# Patient Record
Sex: Female | Born: 1950 | Race: Black or African American | Hispanic: No | Marital: Married | Smoking: Never smoker
Health system: Southern US, Community
[De-identification: ages and names within clinical notes are randomized; demographics above are authoritative.]

## PROBLEM LIST (undated history)

## (undated) DIAGNOSIS — I517 Cardiomegaly: Secondary | ICD-10-CM

## (undated) DIAGNOSIS — I10 Essential (primary) hypertension: Secondary | ICD-10-CM

## (undated) DIAGNOSIS — F32A Depression, unspecified: Secondary | ICD-10-CM

## (undated) DIAGNOSIS — H9190 Unspecified hearing loss, unspecified ear: Secondary | ICD-10-CM

## (undated) DIAGNOSIS — E7800 Pure hypercholesterolemia, unspecified: Secondary | ICD-10-CM

## (undated) DIAGNOSIS — M1990 Unspecified osteoarthritis, unspecified site: Secondary | ICD-10-CM

## (undated) HISTORY — PX: ABDOMINAL HYSTERECTOMY: SHX81

## (undated) HISTORY — PX: TUMOR EXCISION: SHX421

## (undated) HISTORY — PX: INGUINAL HERNIA REPAIR: SUR1180

## (undated) HISTORY — DX: Depression, unspecified: F32.A

## (undated) HISTORY — PX: FOOT SURGERY: SHX648

## (undated) HISTORY — DX: Unspecified osteoarthritis, unspecified site: M19.90

## (undated) HISTORY — DX: Cardiomegaly: I51.7

---

## 1998-09-13 ENCOUNTER — Ambulatory Visit (HOSPITAL_COMMUNITY): Admission: RE | Admit: 1998-09-13 | Discharge: 1998-09-13 | Payer: Self-pay | Admitting: Orthopedic Surgery

## 1998-09-13 ENCOUNTER — Encounter: Payer: Self-pay | Admitting: Orthopedic Surgery

## 1998-10-12 ENCOUNTER — Ambulatory Visit (HOSPITAL_COMMUNITY): Admission: RE | Admit: 1998-10-12 | Discharge: 1998-10-12 | Payer: Self-pay | Admitting: Orthopedic Surgery

## 1999-04-10 ENCOUNTER — Ambulatory Visit (HOSPITAL_COMMUNITY): Admission: RE | Admit: 1999-04-10 | Discharge: 1999-04-10 | Payer: Self-pay

## 1999-05-14 ENCOUNTER — Ambulatory Visit (HOSPITAL_COMMUNITY): Admission: RE | Admit: 1999-05-14 | Discharge: 1999-05-14 | Payer: Self-pay

## 1999-05-16 ENCOUNTER — Encounter: Admission: RE | Admit: 1999-05-16 | Discharge: 1999-05-16 | Payer: Self-pay | Admitting: Obstetrics

## 1999-05-17 ENCOUNTER — Ambulatory Visit (HOSPITAL_COMMUNITY): Admission: RE | Admit: 1999-05-17 | Discharge: 1999-05-17 | Payer: Self-pay

## 1999-05-17 ENCOUNTER — Encounter (INDEPENDENT_AMBULATORY_CARE_PROVIDER_SITE_OTHER): Payer: Self-pay | Admitting: Specialist

## 1999-06-19 ENCOUNTER — Encounter: Admission: RE | Admit: 1999-06-19 | Discharge: 1999-06-19 | Payer: Self-pay | Admitting: Hematology and Oncology

## 2000-01-09 ENCOUNTER — Encounter: Admission: RE | Admit: 2000-01-09 | Discharge: 2000-01-09 | Payer: Self-pay | Admitting: Internal Medicine

## 2000-06-25 ENCOUNTER — Encounter: Admission: RE | Admit: 2000-06-25 | Discharge: 2000-06-25 | Payer: Self-pay | Admitting: Obstetrics

## 2000-08-11 ENCOUNTER — Encounter: Admission: RE | Admit: 2000-08-11 | Discharge: 2000-08-11 | Payer: Self-pay | Admitting: Obstetrics & Gynecology

## 2000-10-20 ENCOUNTER — Encounter: Admission: RE | Admit: 2000-10-20 | Discharge: 2000-10-20 | Payer: Self-pay | Admitting: Obstetrics & Gynecology

## 2000-10-30 ENCOUNTER — Ambulatory Visit (HOSPITAL_COMMUNITY): Admission: RE | Admit: 2000-10-30 | Discharge: 2000-10-30 | Payer: Self-pay | Admitting: Obstetrics & Gynecology

## 2001-03-15 ENCOUNTER — Encounter: Admission: RE | Admit: 2001-03-15 | Discharge: 2001-03-15 | Payer: Self-pay

## 2002-04-21 ENCOUNTER — Encounter: Admission: RE | Admit: 2002-04-21 | Discharge: 2002-04-21 | Payer: Self-pay | Admitting: Internal Medicine

## 2002-04-25 ENCOUNTER — Encounter: Admission: RE | Admit: 2002-04-25 | Discharge: 2002-04-25 | Payer: Self-pay | Admitting: Internal Medicine

## 2002-04-25 ENCOUNTER — Encounter: Payer: Self-pay | Admitting: Internal Medicine

## 2003-03-23 ENCOUNTER — Encounter: Admission: RE | Admit: 2003-03-23 | Discharge: 2003-03-23 | Payer: Self-pay | Admitting: Obstetrics and Gynecology

## 2003-04-04 ENCOUNTER — Encounter: Admission: RE | Admit: 2003-04-04 | Discharge: 2003-04-04 | Payer: Self-pay | Admitting: Internal Medicine

## 2003-10-11 ENCOUNTER — Emergency Department (HOSPITAL_COMMUNITY): Admission: EM | Admit: 2003-10-11 | Discharge: 2003-10-11 | Payer: Self-pay | Admitting: Emergency Medicine

## 2004-01-26 ENCOUNTER — Encounter: Admission: RE | Admit: 2004-01-26 | Discharge: 2004-01-26 | Payer: Self-pay | Admitting: Family Medicine

## 2004-12-06 ENCOUNTER — Ambulatory Visit: Payer: Self-pay | Admitting: Family Medicine

## 2004-12-06 ENCOUNTER — Ambulatory Visit (HOSPITAL_COMMUNITY): Admission: RE | Admit: 2004-12-06 | Discharge: 2004-12-06 | Payer: Self-pay | Admitting: Internal Medicine

## 2004-12-30 ENCOUNTER — Ambulatory Visit: Payer: Self-pay | Admitting: Internal Medicine

## 2005-01-13 ENCOUNTER — Ambulatory Visit: Payer: Self-pay | Admitting: Internal Medicine

## 2005-02-04 ENCOUNTER — Ambulatory Visit (HOSPITAL_COMMUNITY): Admission: RE | Admit: 2005-02-04 | Discharge: 2005-02-04 | Payer: Self-pay | Admitting: Internal Medicine

## 2005-02-04 ENCOUNTER — Encounter (INDEPENDENT_AMBULATORY_CARE_PROVIDER_SITE_OTHER): Payer: Self-pay | Admitting: Cardiology

## 2005-02-06 ENCOUNTER — Ambulatory Visit: Payer: Self-pay | Admitting: Internal Medicine

## 2005-05-23 ENCOUNTER — Ambulatory Visit: Payer: Self-pay | Admitting: Internal Medicine

## 2005-05-27 ENCOUNTER — Ambulatory Visit (HOSPITAL_COMMUNITY): Admission: RE | Admit: 2005-05-27 | Discharge: 2005-05-27 | Payer: Self-pay | Admitting: Internal Medicine

## 2005-05-30 ENCOUNTER — Ambulatory Visit: Payer: Self-pay | Admitting: Internal Medicine

## 2005-06-20 ENCOUNTER — Encounter: Admission: RE | Admit: 2005-06-20 | Discharge: 2005-06-20 | Payer: Self-pay | Admitting: Internal Medicine

## 2005-09-05 ENCOUNTER — Ambulatory Visit: Payer: Self-pay | Admitting: Hospitalist

## 2006-01-27 ENCOUNTER — Encounter: Admission: RE | Admit: 2006-01-27 | Discharge: 2006-01-27 | Payer: Self-pay | Admitting: Internal Medicine

## 2006-03-23 ENCOUNTER — Encounter (INDEPENDENT_AMBULATORY_CARE_PROVIDER_SITE_OTHER): Payer: Self-pay | Admitting: Internal Medicine

## 2006-03-23 ENCOUNTER — Ambulatory Visit: Payer: Self-pay | Admitting: Internal Medicine

## 2006-03-23 LAB — CONVERTED CEMR LAB
ALT: 9 units/L (ref 0–40)
BUN: 6 mg/dL (ref 6–23)
Cholesterol: 246 mg/dL — ABNORMAL HIGH (ref 0–200)
Creatinine, Ser: 0.73 mg/dL (ref 0.40–1.20)
HDL: 39 mg/dL — ABNORMAL LOW (ref >39)
Potassium: 3.9 meq/L (ref 3.5–5.3)
Triglycerides: 485 mg/dL — ABNORMAL HIGH (ref <150)

## 2006-08-18 ENCOUNTER — Ambulatory Visit: Payer: Self-pay | Admitting: Family Medicine

## 2006-08-18 ENCOUNTER — Ambulatory Visit (HOSPITAL_COMMUNITY): Admission: RE | Admit: 2006-08-18 | Discharge: 2006-08-18 | Payer: Self-pay | Admitting: Internal Medicine

## 2006-08-25 ENCOUNTER — Ambulatory Visit: Payer: Self-pay | Admitting: Family Medicine

## 2006-08-25 ENCOUNTER — Ambulatory Visit (HOSPITAL_COMMUNITY): Admission: RE | Admit: 2006-08-25 | Discharge: 2006-08-25 | Payer: Self-pay | Admitting: Internal Medicine

## 2006-09-01 ENCOUNTER — Ambulatory Visit: Payer: Self-pay | Admitting: Family Medicine

## 2006-09-14 ENCOUNTER — Ambulatory Visit: Payer: Self-pay | Admitting: Family Medicine

## 2006-09-14 ENCOUNTER — Ambulatory Visit (HOSPITAL_COMMUNITY): Admission: RE | Admit: 2006-09-14 | Discharge: 2006-09-14 | Payer: Self-pay | Admitting: Family Medicine

## 2006-09-16 ENCOUNTER — Ambulatory Visit: Payer: Self-pay | Admitting: Family Medicine

## 2006-10-29 ENCOUNTER — Ambulatory Visit (HOSPITAL_COMMUNITY): Admission: RE | Admit: 2006-10-29 | Discharge: 2006-10-29 | Payer: Self-pay | Admitting: Orthopaedic Surgery

## 2006-12-21 ENCOUNTER — Ambulatory Visit: Payer: Self-pay | Admitting: Family Medicine

## 2007-08-27 ENCOUNTER — Ambulatory Visit: Payer: Self-pay | Admitting: Family Medicine

## 2007-08-27 ENCOUNTER — Encounter: Payer: Self-pay | Admitting: Family Medicine

## 2007-11-08 ENCOUNTER — Ambulatory Visit: Payer: Self-pay | Admitting: Internal Medicine

## 2008-02-09 ENCOUNTER — Encounter: Payer: Self-pay | Admitting: Family Medicine

## 2008-02-09 ENCOUNTER — Ambulatory Visit: Payer: Self-pay | Admitting: Internal Medicine

## 2008-02-09 LAB — CONVERTED CEMR LAB
ALT: 14 units/L (ref 0–35)
AST: 17 units/L (ref 0–37)
Basophils Absolute: 0 10*3/uL (ref 0.0–0.1)
Basophils Relative: 0 % (ref 0–1)
CO2: 26 meq/L (ref 19–32)
Calcium: 9.8 mg/dL (ref 8.4–10.5)
Cholesterol: 223 mg/dL — ABNORMAL HIGH (ref 0–200)
Creatinine, Ser: 0.75 mg/dL (ref 0.40–1.20)
Eosinophils Absolute: 0.2 10*3/uL (ref 0.0–0.7)
HCT: 42.7 % (ref 36.0–46.0)
HDL: 49 mg/dL (ref >39)
Hemoglobin: 14 g/dL (ref 12.0–15.0)
Lymphocytes Relative: 24 % (ref 12–46)
MCHC: 32.8 g/dL (ref 30.0–36.0)
MCV: 92.4 fL (ref 78.0–100.0)
Monocytes Absolute: 0.5 10*3/uL (ref 0.1–1.0)
Neutro Abs: 4.6 10*3/uL (ref 1.7–7.7)
Potassium: 4 meq/L (ref 3.5–5.3)
RDW: 13.8 % (ref 11.5–15.5)
Sed Rate: 20 mm/hr (ref 0–22)
Sodium: 142 meq/L (ref 135–145)
Total CHOL/HDL Ratio: 4.6
Total Protein: 8 g/dL (ref 6.0–8.3)
Triglycerides: 319 mg/dL — ABNORMAL HIGH (ref <150)
VLDL: 64 mg/dL — ABNORMAL HIGH (ref 0–40)
WBC: 6.9 10*3/uL (ref 4.0–10.5)

## 2008-07-19 ENCOUNTER — Emergency Department (HOSPITAL_COMMUNITY): Admission: EM | Admit: 2008-07-19 | Discharge: 2008-07-19 | Payer: Self-pay | Admitting: *Deleted

## 2008-09-11 ENCOUNTER — Ambulatory Visit: Payer: Self-pay | Admitting: Family Medicine

## 2008-10-05 ENCOUNTER — Ambulatory Visit: Payer: Self-pay | Admitting: Internal Medicine

## 2008-10-05 ENCOUNTER — Encounter: Payer: Self-pay | Admitting: Family Medicine

## 2008-10-05 LAB — CONVERTED CEMR LAB
ALT: 11 units/L (ref 0–35)
Albumin: 4.3 g/dL (ref 3.5–5.2)
BUN: 14 mg/dL (ref 6–23)
CO2: 26 meq/L (ref 19–32)
Creatinine, Ser: 0.95 mg/dL (ref 0.40–1.20)
Glucose, Bld: 96 mg/dL (ref 70–99)
Total Bilirubin: 0.3 mg/dL (ref 0.3–1.2)

## 2008-11-17 ENCOUNTER — Ambulatory Visit: Payer: Self-pay | Admitting: Internal Medicine

## 2008-11-20 ENCOUNTER — Ambulatory Visit (HOSPITAL_COMMUNITY): Admission: RE | Admit: 2008-11-20 | Discharge: 2008-11-20 | Payer: Self-pay | Admitting: Family Medicine

## 2009-02-14 ENCOUNTER — Ambulatory Visit: Payer: Self-pay | Admitting: Internal Medicine

## 2009-02-15 ENCOUNTER — Encounter (INDEPENDENT_AMBULATORY_CARE_PROVIDER_SITE_OTHER): Payer: Self-pay | Admitting: Internal Medicine

## 2009-02-15 LAB — CONVERTED CEMR LAB
Cholesterol: 246 mg/dL — ABNORMAL HIGH (ref 0–200)
HDL: 46 mg/dL (ref >39)
Triglycerides: 527 mg/dL — ABNORMAL HIGH (ref <150)

## 2009-02-19 ENCOUNTER — Ambulatory Visit: Payer: Self-pay | Admitting: Internal Medicine

## 2009-02-26 ENCOUNTER — Emergency Department (HOSPITAL_COMMUNITY): Admission: EM | Admit: 2009-02-26 | Discharge: 2009-02-26 | Payer: Self-pay | Admitting: Emergency Medicine

## 2009-03-12 ENCOUNTER — Telehealth (INDEPENDENT_AMBULATORY_CARE_PROVIDER_SITE_OTHER): Payer: Self-pay | Admitting: *Deleted

## 2009-07-31 ENCOUNTER — Ambulatory Visit: Payer: Self-pay | Admitting: Internal Medicine

## 2009-07-31 LAB — CONVERTED CEMR LAB
Albumin: 4.8 g/dL (ref 3.5–5.2)
Alkaline Phosphatase: 66 units/L (ref 39–117)
CO2: 23 meq/L (ref 19–32)
Chloride: 104 meq/L (ref 96–112)
Cholesterol: 284 mg/dL — ABNORMAL HIGH (ref 0–200)
LDL Cholesterol: 209 mg/dL — ABNORMAL HIGH (ref 0–99)
Sodium: 140 meq/L (ref 135–145)
Total Bilirubin: 0.5 mg/dL (ref 0.3–1.2)
Total CHOL/HDL Ratio: 5.3

## 2009-08-27 ENCOUNTER — Ambulatory Visit: Payer: Self-pay | Admitting: Family Medicine

## 2009-09-10 ENCOUNTER — Observation Stay (HOSPITAL_COMMUNITY): Admission: EM | Admit: 2009-09-10 | Discharge: 2009-09-11 | Payer: Self-pay | Admitting: Emergency Medicine

## 2009-09-24 ENCOUNTER — Ambulatory Visit: Payer: Self-pay | Admitting: Internal Medicine

## 2009-09-24 LAB — CONVERTED CEMR LAB
BUN: 10 mg/dL (ref 6–23)
Calcium: 9.4 mg/dL (ref 8.4–10.5)
Chloride: 96 meq/L (ref 96–112)
Creatinine, Ser: 0.81 mg/dL (ref 0.40–1.20)
Glucose, Bld: 89 mg/dL (ref 70–99)
HDL: 41 mg/dL (ref >39)
LDL Cholesterol: 75 mg/dL (ref 0–99)
Potassium: 4.4 meq/L (ref 3.5–5.3)
Sodium: 134 meq/L — ABNORMAL LOW (ref 135–145)
Total CHOL/HDL Ratio: 3.6
Triglycerides: 167 mg/dL — ABNORMAL HIGH (ref <150)

## 2009-10-08 ENCOUNTER — Ambulatory Visit: Payer: Self-pay | Admitting: Family Medicine

## 2009-11-04 ENCOUNTER — Emergency Department (HOSPITAL_COMMUNITY): Admission: EM | Admit: 2009-11-04 | Discharge: 2009-11-04 | Payer: Self-pay | Admitting: Emergency Medicine

## 2009-12-05 ENCOUNTER — Ambulatory Visit (HOSPITAL_COMMUNITY): Admission: RE | Admit: 2009-12-05 | Discharge: 2009-12-05 | Payer: Self-pay | Admitting: Internal Medicine

## 2009-12-12 ENCOUNTER — Ambulatory Visit: Payer: Self-pay | Admitting: Family Medicine

## 2010-06-21 ENCOUNTER — Encounter (INDEPENDENT_AMBULATORY_CARE_PROVIDER_SITE_OTHER): Payer: Self-pay | Admitting: *Deleted

## 2010-06-21 LAB — CONVERTED CEMR LAB
ALT: <8 units/L (ref 0–35)
Albumin: 4.5 g/dL (ref 3.5–5.2)
Alkaline Phosphatase: 70 units/L (ref 39–117)
CO2: 27 meq/L (ref 19–32)
Calcium: 9.6 mg/dL (ref 8.4–10.5)
Chloride: 102 meq/L (ref 96–112)
Cholesterol: 241 mg/dL — ABNORMAL HIGH (ref 0–200)
Creatinine, Ser: 0.73 mg/dL (ref 0.40–1.20)
Potassium: 4 meq/L (ref 3.5–5.3)
Total CHOL/HDL Ratio: 5.5
Total Protein: 8.1 g/dL (ref 6.0–8.3)

## 2010-06-22 IMAGING — CT CT ABD-PELV W/ CM
2 of 5 series · 17 of 46 positions shown, 19 images · IV contrast (agent unspecified)
Comparison: Chest and two views abdomen on this same date.

CLINICAL DATA: Abdominal pain.

CT ABDOMEN AND PELVIS WITH CONTRAST
TECHNIQUE: Multidetector CT imaging of the abdomen and pelvis was
performed following the standard protocol during bolus
administration of intravenous contrast.
Contrast: 100 ml [id number].

[Series 2: routine abdomen · axial · 0.62mm/px · z∈[-378,+32]mm · 14 of 92 slices shown, 16 images]
[im 5/92  soft-tissue]
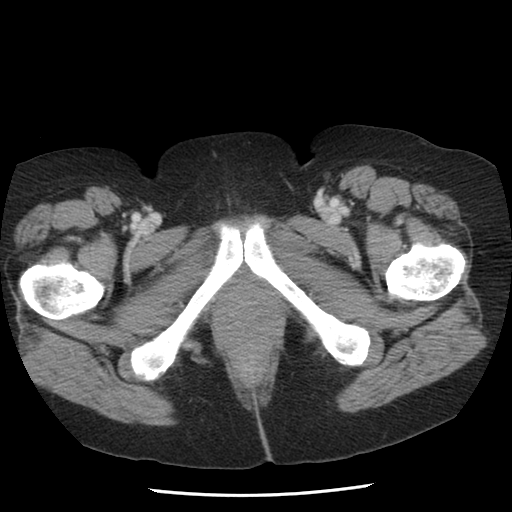
[im 5/92  bone]
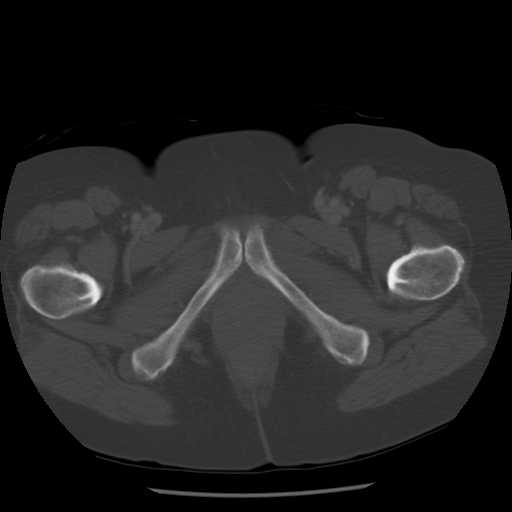
[im 14/92  soft-tissue]
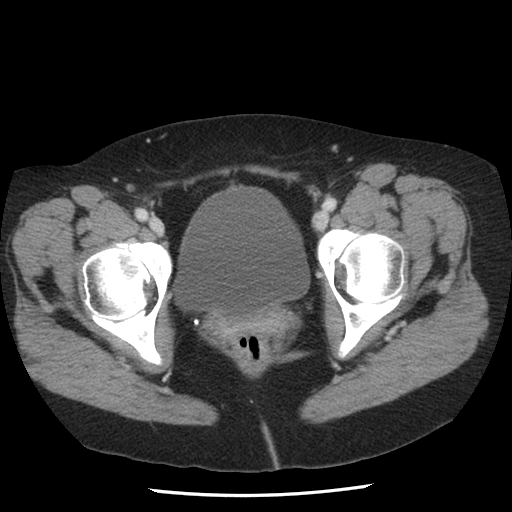
[im 19/92  soft-tissue]
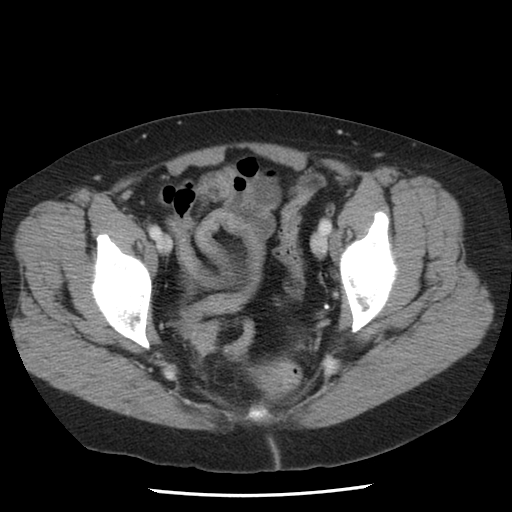
[im 23/92  soft-tissue]
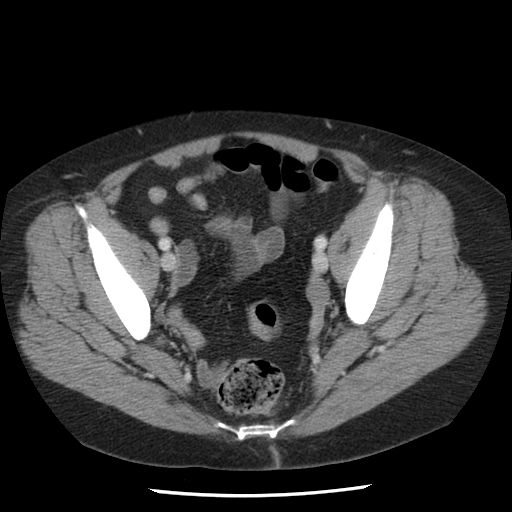
[im 32/92  soft-tissue]
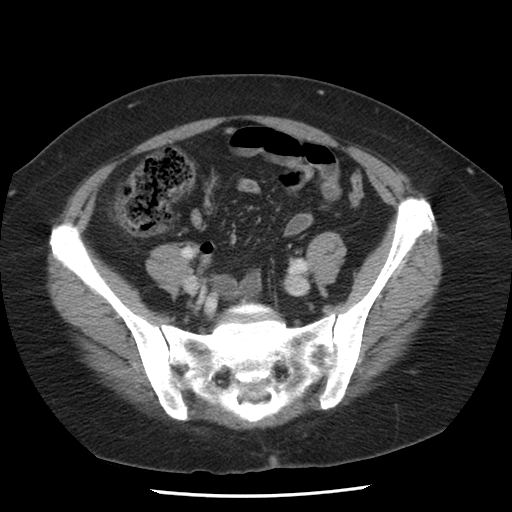
[im 37/92  soft-tissue]
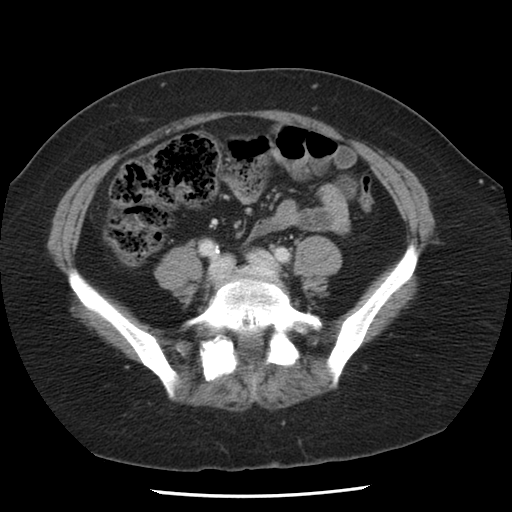
[im 41/92  soft-tissue]
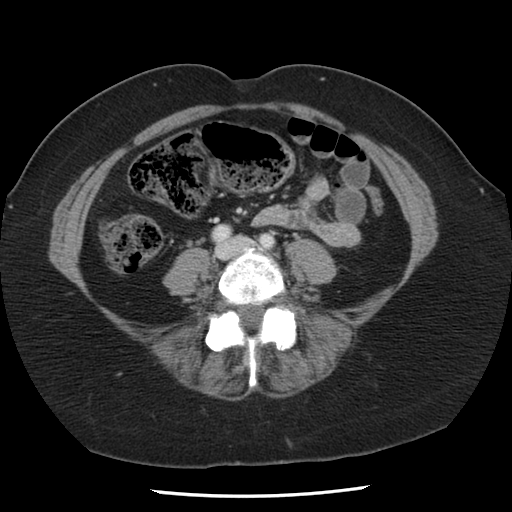
[im 51/92  soft-tissue]
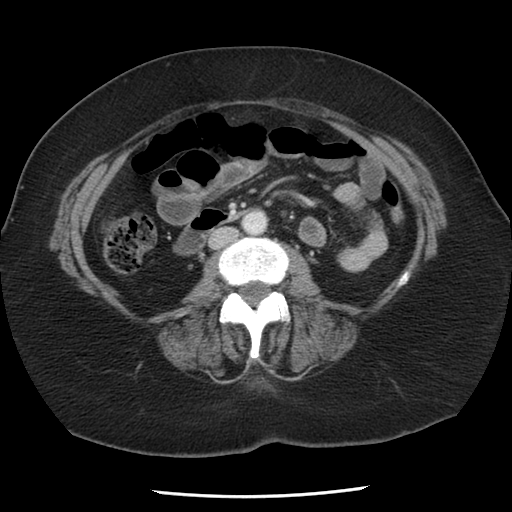
[im 55/92  soft-tissue]
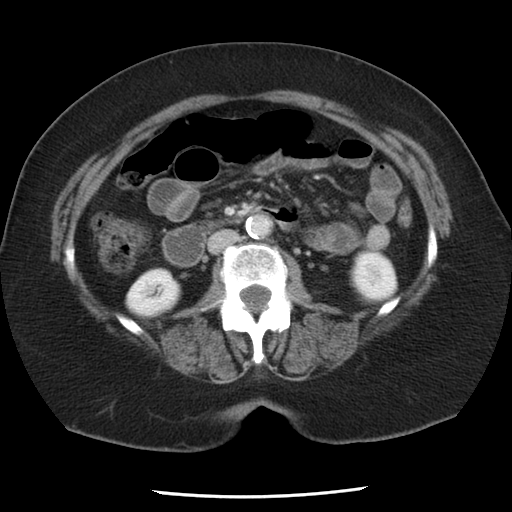
[im 55/92  bone]
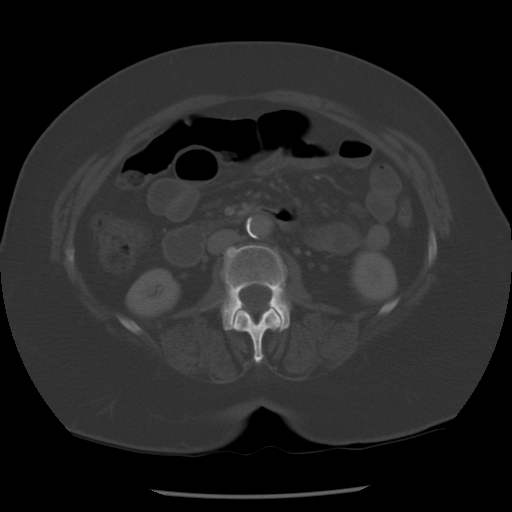
[im 60/92  soft-tissue]
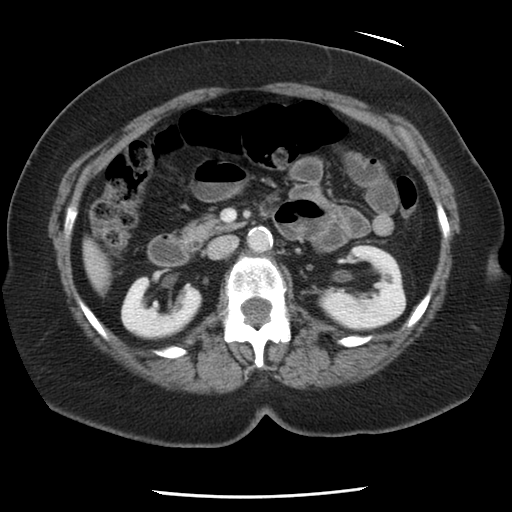
[im 69/92  soft-tissue]
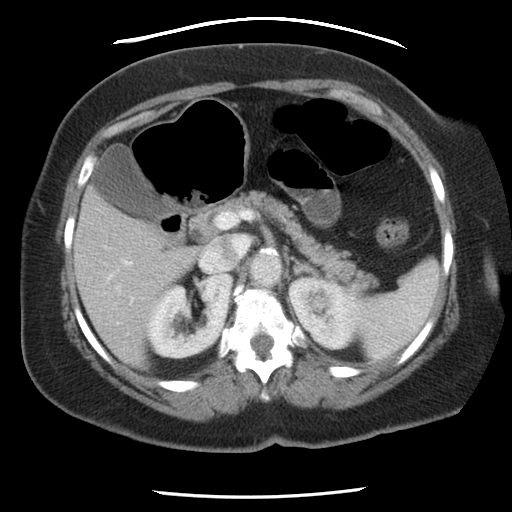
[im 73/92  soft-tissue]
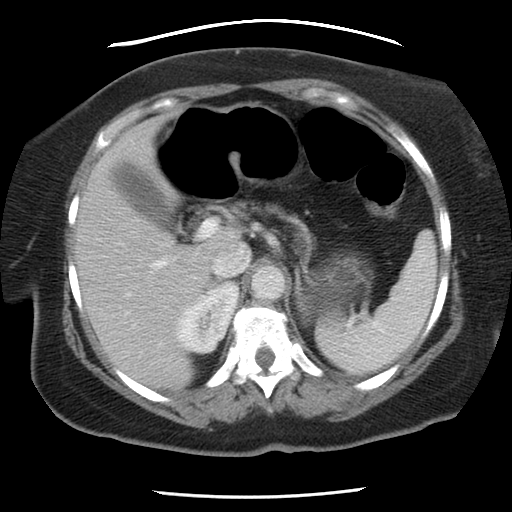
[im 78/92  soft-tissue]
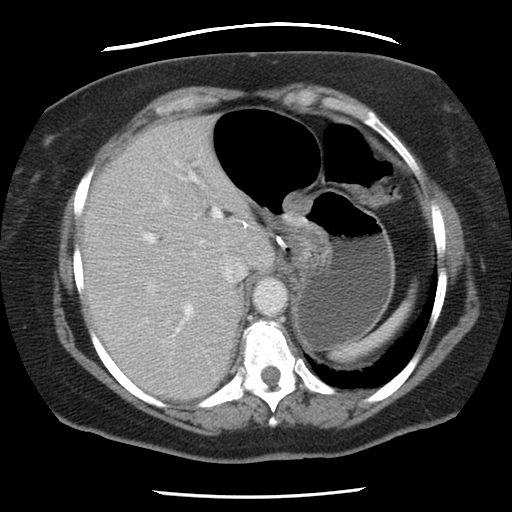
[im 87/92  soft-tissue]
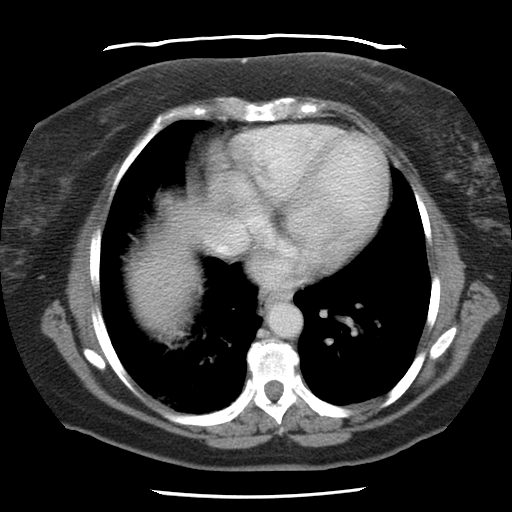

[Series 401: cor · coronal · 0.94mm/px · 3 of 104 slices shown]
[im 35/104  soft-tissue]
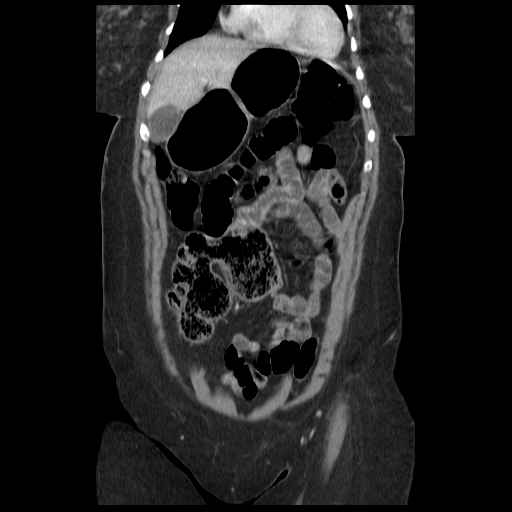
[im 46/104  soft-tissue]
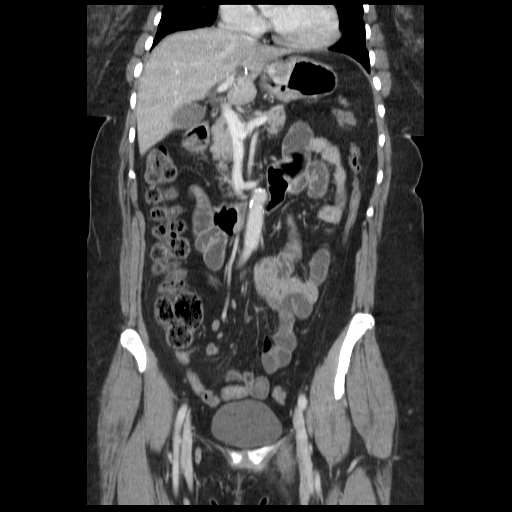
[im 58/104  soft-tissue]
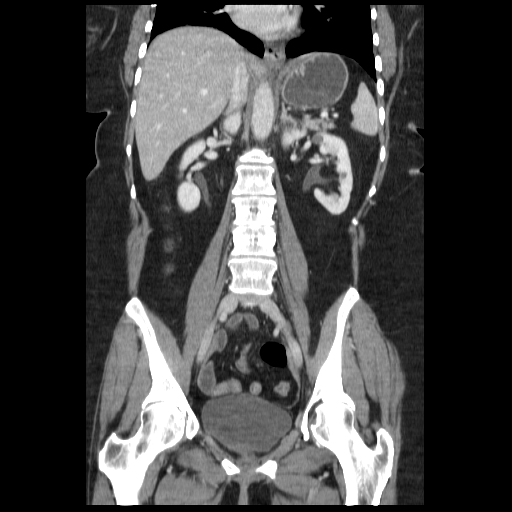

[17 of 46 positions shown; findings below may reference images not displayed]

FINDINGS: There is some dependent atelectatic change in the lung
bases.  No pleural or pericardial effusion.

Postoperative change at the gastroesophageal junction is noted.
Small bowel is unremarkable.  The colon and appendix are
unremarkable.

The liver, gallbladder, biliary tree, pancreas, spleen, adrenal
glands and right kidney are unremarkable.  Small left renal cyst is
noted.  No lymphadenopathy or fluid.  The patient is status post
hysterectomy.  Adnexa are unremarkable.  No focal bony abnormality.
IMPRESSION: 1.  No acute finding or finding to explain the patient's abdominal
pain.
2.  Status post cholecystectomy and hysterectomy.

## 2010-06-29 ENCOUNTER — Encounter: Payer: Self-pay | Admitting: *Deleted

## 2010-06-30 ENCOUNTER — Encounter: Payer: Self-pay | Admitting: Internal Medicine

## 2010-06-30 ENCOUNTER — Encounter: Payer: Self-pay | Admitting: Family Medicine

## 2010-07-01 ENCOUNTER — Encounter: Payer: Self-pay | Admitting: Internal Medicine

## 2010-08-28 LAB — URINALYSIS, ROUTINE W REFLEX MICROSCOPIC
Bilirubin Urine: NEGATIVE
Hgb urine dipstick: NEGATIVE
Ketones, ur: NEGATIVE mg/dL
Protein, ur: NEGATIVE mg/dL
Urobilinogen, UA: 0.2 mg/dL (ref 0.0–1.0)

## 2010-08-28 LAB — CBC
Hemoglobin: 14.1 g/dL (ref 12.0–15.0)
MCHC: 35 g/dL (ref 30.0–36.0)
MCV: 90.1 fL (ref 78.0–100.0)
MCV: 90.1 fL (ref 78.0–100.0)
Platelets: 348 10*3/uL (ref 150–400)
RBC: 4.54 MIL/uL (ref 3.87–5.11)

## 2010-08-28 LAB — DIFFERENTIAL
Monocytes Absolute: 1 10*3/uL (ref 0.1–1.0)
Monocytes Relative: 10 % (ref 3–12)
Neutro Abs: 6.9 10*3/uL (ref 1.7–7.7)

## 2010-08-28 LAB — COMPREHENSIVE METABOLIC PANEL
ALT: 16 U/L (ref 0–35)
AST: 23 U/L (ref 0–37)
Albumin: 3.6 g/dL (ref 3.5–5.2)
Albumin: 4.6 g/dL (ref 3.5–5.2)
BUN: 8 mg/dL (ref 6–23)
Calcium: 8.9 mg/dL (ref 8.4–10.5)
Creatinine, Ser: 0.82 mg/dL (ref 0.4–1.2)
Creatinine, Ser: 0.92 mg/dL (ref 0.4–1.2)
GFR calc Af Amer: >60 mL/min (ref >60)
GFR calc Af Amer: >60 mL/min (ref >60)
GFR calc non Af Amer: >60 mL/min (ref >60)
GFR calc non Af Amer: >60 mL/min (ref >60)
Glucose, Bld: 135 mg/dL — ABNORMAL HIGH (ref 70–99)
Potassium: 3.1 mEq/L — ABNORMAL LOW (ref 3.5–5.1)
Sodium: 122 mEq/L — ABNORMAL LOW (ref 135–145)
Total Bilirubin: 0.6 mg/dL (ref 0.3–1.2)

## 2010-08-28 LAB — CK TOTAL AND CKMB (NOT AT ARMC)
CK, MB: 3.7 ng/mL (ref 0.3–4.0)
Relative Index: 1.1 (ref 0.0–2.5)

## 2010-10-25 NOTE — Group Therapy Note (Signed)
NAME:  Kara Saunders, Kara Saunders                        ACCOUNT NO.:  [id number]   MEDICAL RECORD NO.:  [id number]                   PATIENT TYPE:  OUT   LOCATION:  [facility]                           FACILITY:  [facility]   PHYSICIAN:  Tinnie Gens, MD                     DATE OF BIRTH:  1950/08/20   DATE OF SERVICE:  03/23/2003                                    CLINIC NOTE   CHIEF COMPLAINT:  Pap smear.   HISTORY OF PRESENT ILLNESS:  The patient is a 59 year old gravida 1 para 1  who has been seen apparently in the GYN clinic when it was at [facility].  She apparently has had a TVH with a right oophorectomy she thinks, but has  had recurrent vaginal infections including Trichomonas and BV.  She does  report that she takes sit-down baths on a regular basis and she does douche  every six months.   PAST MEDICAL HISTORY:  Negative.   PAST SURGICAL HISTORY:  She has had surgery on her foot, the TVH and RSO,  some sort of tracheostomy when she was [age], hernia repair of her abdomen in  four or five different places.   OBSTETRICAL HISTORY:  She is a G1 P1.   GYNECOLOGICAL HISTORY:  She has been status post hysterectomy for fibroid  uterus and amenorrheic because of this since she was 60 years old.   FAMILY HISTORY:  Coronary artery disease and hypertension.   SOCIAL HISTORY:  She does not drink or smoke.  She is deaf.   MEDICATIONS:  She is on no medications.   ALLERGIES:  No known allergies.   REVIEW OF SYSTEMS:  A 14-point review of systems is negative except for  vaginal odor, vaginal itching, and occasional headache.   PHYSICAL EXAMINATION:  VITAL SIGNS:  Blood pressure was 143/78, weight is  142.8, pulse was 60.  GENERAL:  She is a well-developed, well-nourished black female in no acute  distress.  LUNGS:  Clear bilaterally.  CARDIOVASCULAR:  Regular rate and rhythm without rubs, gallop, or murmur.  ABDOMEN:  Soft, nontender, nondistended.  BREASTS:  Symmetric with everted  nipples.  There are no masses.  There is no  supraclavicular or axillary lymphadenopathy.  GENITOURINARY:  She has normal external female genitalia, vagina that is  atrophic, and pale pink.  There is no cervix visualized.  The uterus is  absent.  On bimanual exam there are no masses or tenderness.  Wet prep  positive clue cells; no trich, no yeast.   IMPRESSION:  1. Gynecological exam.  Given that this patient is status post hysterectomy     for a benign disease it is felt that she likely does not need any further     follow-up Paps after this time.  2. Bacterial vaginosis.   PLAN:  1. Pap smear today.  2. Order mammogram.  3. Discussed precipitating factors for bacterial and  candidal vaginitis.     Discussed hazards of douching as well as baths.  The patient understands     these risks.  4. Flagyl 500 mg one p.o. b.i.d. for seven days.   She will follow up as needed.                                               Tinnie Gens, MD    TP/MEDQ  D:  03/23/2003  T:  03/24/2003  Job:  [id number]

## 2010-10-25 NOTE — Group Therapy Note (Signed)
NAME:  Kara Saunders, ANTHIS NO.:  [id number]   MEDICAL RECORD NO.:  [id number]          PATIENT TYPE:  WOC   LOCATION:  [facility]                   FACILITY:  [facility]   PHYSICIAN:  Tinnie Gens, MD        DATE OF BIRTH:  Mar 05, 1951   DATE OF SERVICE:                                    CLINIC NOTE   CHIEF COMPLAINT:  Yearly exam.   HISTORY OF PRESENT ILLNESS:  The patient is a 60 year old hearing impaired  patient, who is a gravida 1, para 1, who underwent total vaginal  hysterectomy with right oophorectomy many years ago.  Her last Pap smear was  in October of 2004 and she has no clear indication for a repeat Pap smear.  The patient does report some vaginal discharge with itching that felt like a  yeast infection several weeks ago.  She is still having some symptoms of  itching and discharge, but associated bleeding.   PAST MEDICAL HISTORY:  Negative.   PAST SURGICAL HISTORY:  1.  Surgery on her foot.  2.  TVH/RSO.  3.  Tracheostomy.  4.  Hernia repair.   MEDICATIONS:  None.   ALLERGIES:  None known.   OBSTETRIC HISTORY:  G1, P1.   GYNECOLOGIC HISTORY:  Hysterectomy for fibroids and amenorrhea.   FAMILY HISTORY:  Coronary artery disease, hypertension.   SOCIAL HISTORY:  No tobacco or drug use.   A 14 point review of systems was reviewed and was negative, except for as in  the HPI.   PHYSICAL EXAMINATION:  VITAL SIGNS:  Blood pressure 157/70.  GENERAL:  She is a well-developed, well-nourished white female in no acute  distress.  ABDOMEN:  Soft, nontender and nondistended.  GU:  Normal external female genitalia.  The vagina was pink.  The uterus was  absent.  Bimanual, there is no mass or tenderness.   IMPRESSION:  GYN exam.  No indication for another Pap smear. In fact, she  does not need Paps any more after this time.   PLAN:  1.  Wet prep today.  2.  The patient gets her mammogram today as well.  3.  The patient will follow up at outpatient clinic  for her elevated blood      pressure.  4.  We will send the patient a letter regarding the results of her wet prep      if she has no __________.       TP/MEDQ  D:  12/06/2004  T:  12/06/2004  Job:  [id number]

## 2010-12-26 ENCOUNTER — Other Ambulatory Visit (HOSPITAL_COMMUNITY): Payer: Self-pay | Admitting: Family Medicine

## 2010-12-26 DIAGNOSIS — Z1231 Encounter for screening mammogram for malignant neoplasm of breast: Secondary | ICD-10-CM

## 2011-01-09 ENCOUNTER — Ambulatory Visit (HOSPITAL_COMMUNITY)
Admission: RE | Admit: 2011-01-09 | Discharge: 2011-01-09 | Disposition: A | Discharge status: Home / Self Care | Payer: Medicaid Other | Source: Ambulatory Visit | Attending: Family Medicine | Admitting: Family Medicine

## 2011-01-09 ENCOUNTER — Ambulatory Visit (HOSPITAL_COMMUNITY): Payer: Self-pay

## 2011-01-09 DIAGNOSIS — Z1231 Encounter for screening mammogram for malignant neoplasm of breast: Secondary | ICD-10-CM

## 2011-01-20 ENCOUNTER — Emergency Department (HOSPITAL_COMMUNITY)
Admission: EM | Admit: 2011-01-20 | Discharge: 2011-01-20 | Disposition: A | Discharge status: Home / Self Care | Payer: Medicaid Other | Attending: Emergency Medicine | Admitting: Emergency Medicine

## 2011-01-20 ENCOUNTER — Emergency Department (HOSPITAL_COMMUNITY): Payer: Medicaid Other

## 2011-01-20 DIAGNOSIS — H9190 Unspecified hearing loss, unspecified ear: Secondary | ICD-10-CM | POA: Insufficient documentation

## 2011-01-20 DIAGNOSIS — J329 Chronic sinusitis, unspecified: Secondary | ICD-10-CM | POA: Insufficient documentation

## 2011-01-20 DIAGNOSIS — R51 Headache: Secondary | ICD-10-CM | POA: Insufficient documentation

## 2011-01-20 DIAGNOSIS — I10 Essential (primary) hypertension: Secondary | ICD-10-CM | POA: Insufficient documentation

## 2011-04-20 ENCOUNTER — Encounter: Payer: Self-pay | Admitting: Emergency Medicine

## 2011-04-20 ENCOUNTER — Emergency Department (HOSPITAL_COMMUNITY): Payer: Medicaid Other

## 2011-04-20 ENCOUNTER — Emergency Department (HOSPITAL_COMMUNITY)
Admission: EM | Admit: 2011-04-20 | Discharge: 2011-04-20 | Disposition: A | Discharge status: Home / Self Care | Payer: Medicaid Other | Attending: Emergency Medicine | Admitting: Emergency Medicine

## 2011-04-20 DIAGNOSIS — K5900 Constipation, unspecified: Secondary | ICD-10-CM | POA: Insufficient documentation

## 2011-04-20 DIAGNOSIS — I10 Essential (primary) hypertension: Secondary | ICD-10-CM | POA: Insufficient documentation

## 2011-04-20 DIAGNOSIS — R110 Nausea: Secondary | ICD-10-CM | POA: Insufficient documentation

## 2011-04-20 DIAGNOSIS — R109 Unspecified abdominal pain: Secondary | ICD-10-CM | POA: Insufficient documentation

## 2011-04-20 DIAGNOSIS — E789 Disorder of lipoprotein metabolism, unspecified: Secondary | ICD-10-CM | POA: Insufficient documentation

## 2011-04-20 DIAGNOSIS — H9190 Unspecified hearing loss, unspecified ear: Secondary | ICD-10-CM | POA: Insufficient documentation

## 2011-04-20 DIAGNOSIS — R10817 Generalized abdominal tenderness: Secondary | ICD-10-CM | POA: Insufficient documentation

## 2011-04-20 DIAGNOSIS — Z79899 Other long term (current) drug therapy: Secondary | ICD-10-CM | POA: Insufficient documentation

## 2011-04-20 DIAGNOSIS — R195 Other fecal abnormalities: Secondary | ICD-10-CM

## 2011-04-20 HISTORY — DX: Essential (primary) hypertension: I10

## 2011-04-20 HISTORY — DX: Unspecified hearing loss, unspecified ear: H91.90

## 2011-04-20 HISTORY — DX: Pure hypercholesterolemia, unspecified: E78.00

## 2011-04-20 LAB — CBC
Hemoglobin: 12.8 g/dL (ref 12.0–15.0)
MCH: 28.7 pg (ref 26.0–34.0)
MCV: 90.1 fL (ref 78.0–100.0)
RBC: 4.46 MIL/uL (ref 3.87–5.11)
WBC: 7.1 10*3/uL (ref 4.0–10.5)

## 2011-04-20 LAB — COMPREHENSIVE METABOLIC PANEL
ALT: 18 U/L (ref 0–35)
Alkaline Phosphatase: 88 U/L (ref 39–117)
BUN: 11 mg/dL (ref 6–23)
CO2: 30 mEq/L (ref 19–32)
GFR calc Af Amer: >90 mL/min (ref >90)
GFR calc non Af Amer: 79 mL/min — ABNORMAL LOW (ref >90)
Glucose, Bld: 93 mg/dL (ref 70–99)
Potassium: 4 mEq/L (ref 3.5–5.1)
Total Bilirubin: 0.2 mg/dL — ABNORMAL LOW (ref 0.3–1.2)
Total Protein: 8.1 g/dL (ref 6.0–8.3)

## 2011-04-20 LAB — DIFFERENTIAL
Eosinophils Absolute: 0.2 10*3/uL (ref 0.0–0.7)
Lymphocytes Relative: 24 % (ref 12–46)
Lymphs Abs: 1.7 10*3/uL (ref 0.7–4.0)
Monocytes Relative: 7 % (ref 3–12)
Neutrophils Relative %: 65 % (ref 43–77)

## 2011-04-20 LAB — URINALYSIS, ROUTINE W REFLEX MICROSCOPIC
Bilirubin Urine: NEGATIVE
Ketones, ur: NEGATIVE mg/dL
Nitrite: NEGATIVE
Protein, ur: NEGATIVE mg/dL
Urobilinogen, UA: 0.2 mg/dL (ref 0.0–1.0)

## 2011-04-20 LAB — LIPASE, BLOOD: Lipase: 35 U/L (ref 11–59)

## 2011-04-20 LAB — URINE MICROSCOPIC-ADD ON

## 2011-04-20 MED ORDER — ONDANSETRON 4 MG PO TBDP: 4.0000 mg | ORAL_TABLET | Freq: Three times a day (TID) | ORAL | Status: AC | PRN

## 2011-04-20 NOTE — ED Notes (Signed)
Interpreter at bedside.

## 2011-04-20 NOTE — ED Notes (Signed)
Pt reports abdominal pain and constipation x 1 week. Pt is deaf and uses paper for communication.

## 2011-04-20 NOTE — ED Provider Notes (Signed)
History     CSN: [id number] Arrival date & time: 04/20/2011  7:51 AM   First MD Initiated Contact with Patient 04/20/11 [phone]      Chief Complaint  Patient presents with  . Constipation  . Abdominal Pain  . Nausea    (Consider location/radiation/quality/duration/timing/severity/associated sxs/prior treatment) HPI This patient presents with one week of a change in her bowel movement pattern, and nausea. Notably the patient is deaf, and the interview is conducted with the assistance of an interpreter. She notes several prior similar episodes in the past year, none with conclusive evaluation. Getting gradually one week ago, the patient notes that she has had looser stool than usual. No constipation, no appreciable change in frequency. She notes during this time her abdomen has been mildly, diffusely tender, and she has been mildly nauseous. No clear alleviating or exacerbating factors. No fever, no emesis, no confusion, no chest pain, no dyspnea, no other notable complaints. Past Medical History  Diagnosis Date  . Hypertension   . High cholesterol   . Deaf     No past surgical history on file.  No family history on file.  History  Substance Use Topics  . Smoking status: Not on file  . Smokeless tobacco: Not on file  . Alcohol Use:     OB History    Grav Para Term Preterm Abortions TAB SAB Ect Mult Living                  Review of Systems Gen: Per HPI HEENT: No HA CV: No CP Resp: No dyspnea Abd: Per HPI, otherwise negative Musk: Per HPI, otherwise negative Neuro: No dysesthesia, or focal changes GU: Per HPI, otherwise negative Skin: Neg Psych: Neg  Allergies  Review of patient's allergies indicates no known allergies.  Home Medications   Current Outpatient Rx  Name Route Sig Dispense Refill  . LISINOPRIL 10 MG PO TABS Oral Take 10 mg by mouth daily.      Marland Kitchen SIMVASTATIN 20 MG PO TABS Oral Take 20 mg by mouth at bedtime.      . TRIAMTERENE-HCTZ 37.5-25 MG PO  CAPS Oral Take 1 capsule by mouth every morning.        BP 117/81  Pulse 60  Temp(Src) 97.9 F (36.6 C) (Oral)  Resp 18  SpO2 98%  Physical Exam  Constitutional: She is oriented to person, place, and time. She appears well-developed and well-nourished.  HENT:  Head: Normocephalic and atraumatic.  Eyes: EOM are normal.  Cardiovascular: Normal rate and regular rhythm.   Pulmonary/Chest: Effort normal and breath sounds normal.  Abdominal: Soft. Normal appearance and bowel sounds are normal. She exhibits no shifting dullness, no distension, no fluid wave, no ascites and no mass. There is generalized tenderness. There is no rigidity, no rebound and no guarding.  Musculoskeletal: She exhibits no edema and no tenderness.  Neurological: She is alert and oriented to person, place, and time.  Skin: Skin is warm and dry.    ED Course  Procedures (including critical care time)  Labs Reviewed  COMPREHENSIVE METABOLIC PANEL - Abnormal; Notable for the following:    Total Bilirubin 0.2 (*)    GFR calc non Af Amer 79 (*)    All other components within normal limits  URINALYSIS, ROUTINE W REFLEX MICROSCOPIC - Abnormal; Notable for the following:    Appearance CLOUDY (*)    Hgb urine dipstick SMALL (*)    Leukocytes, UA TRACE (*)    All other components  within normal limits  URINE MICROSCOPIC-ADD ON - Abnormal; Notable for the following:    Squamous Epithelial / LPF MANY (*)    Bacteria, UA FEW (*)    All other components within normal limits  CBC  DIFFERENTIAL  LIPASE, BLOOD   Dg Abd 2 Views  04/20/2011  *RADIOLOGY REPORT*  Clinical Data: Abdominal pain  ABDOMEN - 2 VIEW  Comparison: CT and plain film 09/09/2009  Findings: No dilated loops of large or small bowel.  There is gas and stool in the rectum. No intraperitoneal free air.  No pathologic calcifications.  IMPRESSION: No acute abdominal process.  Original Report Authenticated By: Genevive Bi, M.D.     No diagnosis  found.    MDM  This 60-year-old female now presents with one week change in her bowel habits. On exam the patient is in no distress and has minimal tenderness of her soft abdomen. Laboratory results were essentially unremarkable with trace hemoglobin in the urine. The patient's history of multiple prior similar episodes is suggestive of IBS, or IBD, less likely a food reaction. On further interview, the patient notes that her mother and her grandmother both have had multiple similar episodes.  Given the absence of acute findings, noting that the blood in the urine may be indicative of occult pathology such as passed kidney stone, or inflammatory cystitis, the patient will follow up with her primary care physician. She has an appointment in 3 days. She will also be provided GI followup.        Gerhard Munch, MD 04/20/11 [phone]

## 2011-05-03 ENCOUNTER — Encounter (HOSPITAL_COMMUNITY): Payer: Self-pay | Admitting: *Deleted

## 2011-05-03 ENCOUNTER — Emergency Department (HOSPITAL_COMMUNITY)
Admission: EM | Admit: 2011-05-03 | Discharge: 2011-05-03 | Disposition: A | Discharge status: Home / Self Care | Payer: Medicaid Other | Attending: Emergency Medicine | Admitting: Emergency Medicine

## 2011-05-03 ENCOUNTER — Emergency Department (HOSPITAL_COMMUNITY): Payer: Medicaid Other

## 2011-05-03 DIAGNOSIS — I10 Essential (primary) hypertension: Secondary | ICD-10-CM | POA: Insufficient documentation

## 2011-05-03 DIAGNOSIS — H9190 Unspecified hearing loss, unspecified ear: Secondary | ICD-10-CM | POA: Insufficient documentation

## 2011-05-03 DIAGNOSIS — E7800 Pure hypercholesterolemia, unspecified: Secondary | ICD-10-CM | POA: Insufficient documentation

## 2011-05-03 DIAGNOSIS — K6289 Other specified diseases of anus and rectum: Secondary | ICD-10-CM | POA: Insufficient documentation

## 2011-05-03 DIAGNOSIS — R110 Nausea: Secondary | ICD-10-CM | POA: Insufficient documentation

## 2011-05-03 DIAGNOSIS — K5900 Constipation, unspecified: Secondary | ICD-10-CM | POA: Insufficient documentation

## 2011-05-03 DIAGNOSIS — R109 Unspecified abdominal pain: Secondary | ICD-10-CM | POA: Insufficient documentation

## 2011-05-03 DIAGNOSIS — Z79899 Other long term (current) drug therapy: Secondary | ICD-10-CM | POA: Insufficient documentation

## 2011-05-03 MED ORDER — DOCUSATE SODIUM 100 MG PO CAPS: 100.0000 mg | ORAL_CAPSULE | Freq: Two times a day (BID) | ORAL | Status: AC

## 2011-05-03 NOTE — ED Notes (Signed)
Pt. Is deaf and an interpreter has been called. Pt. Is able to write notes to communicate at present.  Pt. C/o constipation since Nov 11th. Has been given Miralax with little results per pt. Pt. C/o dull general abd pain. C/o small amount of vomiting. Denies nausea or fever.

## 2011-05-03 NOTE — ED Provider Notes (Signed)
History     CSN: [id number] Arrival date & time: 05/03/2011  7:16 AM   First MD Initiated Contact with Patient 05/03/11 0746     HPI Patient reports she was seen here on 04/20/2011. Was prescribed MiraLAX for constipation. States she has taken it everyday without relief. Reports 2 days ago did have a small bowel movement. States she had persistent straining and difficulty passing stool. Reports intermittent abdominal pain and nausea do to severe constipation. Has not tried any other over-the-counter for constipation. Reports normal belching and flatulence. Denies fever, back pain, blood in stool. Patient is a 60 y.o. female presenting with constipation. The history is provided by the patient. History Limited By: Patient is deaf and needed a sign language interpreter.  Constipation  The current episode started more than 2 weeks ago. The onset was gradual. The problem occurs continuously. The problem has been gradually worsening. The pain is moderate. The stool is described as hard. Treatments tried: MiraLAX. Prior unsuccessful therapies include stool softeners. Associated symptoms include abdominal pain, nausea and rectal pain. Pertinent negatives include no fever, no diarrhea, no vomiting, no hematuria, no vaginal discharge and no chest pain.    Past Medical History  Diagnosis Date  . Hypertension   . High cholesterol   . Deaf     Past Surgical History  Procedure Date  . Hiatal hernia repair   . Abdominal hysterectomy     No family history on file.  History  Substance Use Topics  . Smoking status: Never Smoker   . Smokeless tobacco: Never Used  . Alcohol Use: No    OB History    Grav Para Term Preterm Abortions TAB SAB Ect Mult Living                  Review of Systems  Constitutional: Negative for fever and chills.  Respiratory: Negative for shortness of breath.   Cardiovascular: Negative for chest pain.  Gastrointestinal: Positive for nausea, abdominal pain,  constipation and rectal pain. Negative for vomiting and diarrhea.  Genitourinary: Negative for dysuria, urgency, frequency, hematuria, flank pain, vaginal discharge and vaginal pain.  Musculoskeletal: Negative for back pain.  All other systems reviewed and are negative.    Allergies  Review of patient's allergies indicates no known allergies.  Home Medications   Current Outpatient Rx  Name Route Sig Dispense Refill  . LISINOPRIL 10 MG PO TABS Oral Take 10 mg by mouth daily.      Marland Kitchen ONDANSETRON 4 MG PO TBDP Oral Take 4 mg by mouth every 8 (eight) hours as needed. For nausea     . POLYETHYLENE GLYCOL 3350 PO POWD Oral Take 17 g by mouth daily.      Marland Kitchen PROMETHAZINE HCL 25 MG PO TABS Oral Take 25 mg by mouth 2 (two) times daily as needed. For nausea     . SIMVASTATIN 20 MG PO TABS Oral Take 20 mg by mouth at bedtime.      . TRIAMTERENE-HCTZ 37.5-25 MG PO CAPS Oral Take 1 capsule by mouth every morning.        BP 146/69  Pulse 79  Temp(Src) 97.5 F (36.4 C) (Oral)  Resp 20  SpO2 98%  Physical Exam  Vitals reviewed. Constitutional: She is oriented to person, place, and time. Vital signs are normal. She appears well-developed and well-nourished.  HENT:  Head: Normocephalic and atraumatic.  Eyes: Conjunctivae are normal. Pupils are equal, round, and reactive to light.  Neck: Normal range of motion.  Neck supple.  Cardiovascular: Normal rate, regular rhythm and normal heart sounds.  Exam reveals no friction rub.   No murmur heard. Pulmonary/Chest: Effort normal and breath sounds normal. She has no wheezes. She has no rhonchi. She has no rales. She exhibits no tenderness.  Abdominal: Soft. Bowel sounds are normal. She exhibits no distension and no mass. There is no tenderness. There is no rebound and no guarding.  Genitourinary: Rectum normal. Rectal exam shows no external hemorrhoid, no internal hemorrhoid, no fissure, no mass, no tenderness and anal tone normal.       Small amount of  stool in rectal vault. However patient does not have stool impaction distally.  Musculoskeletal: Normal range of motion.  Neurological: She is alert and oriented to person, place, and time. Coordination normal.  Skin: Skin is warm and dry. No rash noted. No erythema. No pallor.    ED Course  Procedures (including critical care time)  Labs Reviewed - No data to display Dg Abd 2 Views  05/03/2011  *RADIOLOGY REPORT*  Clinical Data: Abdominal pain  ABDOMEN - 2 VIEW  Comparison: 04/20/2011  Findings: Vascular clips in the epigastrium.  There is mild gaseous distention of the stomach with a fluid level.  Small bowel is decompressed.  Normal distribution of gas and stool in the colon. Mild fecal distention of the rectum.  Bilateral pelvic phleboliths. Regional bones unremarkable. No free air.  IMPRESSION:  1.  Nonobstructive bowel gas pattern. 2.  Moderate rectal fecal material. 3.  No free air.  Original Report Authenticated By: Osa Craver, M.D.     No diagnosis found.    MDM    Chaperone in room with me and we'll try to perform a manual disimpaction. Small amount of broken up stool palpated within the rectal vault Unable to remove stool. No free air to stimulate a bowel movement for patient. Advised patient to use over-the-counter tenderness in suppositories. Prescribed patient Colace and advised continued use of MiraLAX. Also refer patient to GI physician if continued constipation.        Thomasene Lot, [place] 05/03/11 1318

## 2011-05-03 NOTE — ED Provider Notes (Signed)
Evaluation and management procedures were performed by the PA/NP under my supervision/collaboration.   Shaquana Buel, MD 05/03/11 1551 

## 2011-05-07 ENCOUNTER — Emergency Department (HOSPITAL_COMMUNITY)
Admission: EM | Admit: 2011-05-07 | Discharge: 2011-05-07 | Disposition: A | Discharge status: Home / Self Care | Payer: Medicaid Other | Attending: Emergency Medicine | Admitting: Emergency Medicine

## 2011-05-07 ENCOUNTER — Encounter (HOSPITAL_COMMUNITY): Payer: Self-pay | Admitting: *Deleted

## 2011-05-07 ENCOUNTER — Telehealth: Payer: Self-pay | Admitting: Internal Medicine

## 2011-05-07 DIAGNOSIS — H9190 Unspecified hearing loss, unspecified ear: Secondary | ICD-10-CM | POA: Insufficient documentation

## 2011-05-07 DIAGNOSIS — K921 Melena: Secondary | ICD-10-CM | POA: Insufficient documentation

## 2011-05-07 DIAGNOSIS — I10 Essential (primary) hypertension: Secondary | ICD-10-CM | POA: Insufficient documentation

## 2011-05-07 DIAGNOSIS — K602 Anal fissure, unspecified: Secondary | ICD-10-CM | POA: Insufficient documentation

## 2011-05-07 DIAGNOSIS — Z79899 Other long term (current) drug therapy: Secondary | ICD-10-CM | POA: Insufficient documentation

## 2011-05-07 DIAGNOSIS — K5900 Constipation, unspecified: Secondary | ICD-10-CM | POA: Insufficient documentation

## 2011-05-07 DIAGNOSIS — E7800 Pure hypercholesterolemia, unspecified: Secondary | ICD-10-CM | POA: Insufficient documentation

## 2011-05-07 LAB — CBC
HCT: 38.8 % (ref 36.0–46.0)
MCV: 89 fL (ref 78.0–100.0)
RBC: 4.36 MIL/uL (ref 3.87–5.11)
RDW: 12.8 % (ref 11.5–15.5)
WBC: 8.4 10*3/uL (ref 4.0–10.5)

## 2011-05-07 MED ORDER — DISPOSABLE ENEMA 19-7 GM/118ML RE ENEM: 1.0000 | ENEMA | Freq: Once | RECTAL | Status: AC

## 2011-05-07 MED ORDER — FLEET ENEMA 7-19 GM/118ML RE ENEM
1.0000 | ENEMA | Freq: Once | RECTAL | Status: AC
  Administered 2011-05-07: 1 via RECTAL
  Filled 2011-05-07: qty 1

## 2011-05-07 NOTE — ED Provider Notes (Signed)
History     CSN: [id number] Arrival date & time: 05/07/2011  3:57 PM   First MD Initiated Contact with Patient 05/07/11 1826      Chief Complaint  Patient presents with  . Constipation  . Rectal Bleeding    (Consider location/radiation/quality/duration/timing/severity/associated sxs/prior treatment) Patient is a 60 y.o. female presenting with constipation and hematochezia. The history is provided by the patient. The history is limited by a language barrier.  Constipation  The current episode started 3 to 5 days ago. The problem occurs occasionally. The problem has been gradually improving. The patient is experiencing no pain. The stool is described as hard. Prior successful therapies include stool softeners and enemas. Pertinent negatives include no anorexia, no fever, no abdominal pain, no diarrhea, no hematemesis, no hemorrhoids, no nausea, no rectal pain, no vomiting, no hematuria, no vaginal bleeding, no vaginal discharge, no chest pain, no headaches, no coughing, no difficulty breathing and no rash. She has been behaving normally. She has been eating and drinking normally.  Rectal Bleeding  Pertinent negatives include no anorexia, no fever, no abdominal pain, no diarrhea, no hematemesis, no hemorrhoids, no nausea, no rectal pain, no vomiting, no hematuria, no vaginal bleeding, no vaginal discharge, no chest pain, no headaches, no coughing, no difficulty breathing and no rash.    Pt presented to the ED with complaints of constipation, with pain during bowerl movements and straining and some blood on her tissue paper when she wiped. Pt has dealt with constipation before and has been to the ED for it a few times. Given some oral medications. Was given a Fleet Enema which relieved her constipation. Pt states she has only had a few small bowel movements since Friday.  Past Medical History  Diagnosis Date  . Hypertension   . High cholesterol   . Deaf     Past Surgical History    Procedure Date  . Hiatal hernia repair   . Abdominal hysterectomy     History reviewed. No pertinent family history.  History  Substance Use Topics  . Smoking status: Never Smoker   . Smokeless tobacco: Never Used  . Alcohol Use: No    OB History    Grav Para Term Preterm Abortions TAB SAB Ect Mult Living                  Review of Systems  Constitutional: Negative for fever.  Respiratory: Negative for cough.   Cardiovascular: Negative for chest pain.  Gastrointestinal: Positive for constipation and hematochezia. Negative for nausea, vomiting, abdominal pain, diarrhea, rectal pain, anorexia, hematemesis and hemorrhoids.  Genitourinary: Negative for hematuria, vaginal bleeding and vaginal discharge.  Skin: Negative for rash.  Neurological: Negative for headaches.  All other systems reviewed and are negative.    Allergies  Review of patient's allergies indicates no known allergies.  Home Medications   Current Outpatient Rx  Name Route Sig Dispense Refill  . DOCUSATE SODIUM 100 MG PO CAPS Oral Take 1 capsule (100 mg total) by mouth every 12 (twelve) hours. 60 capsule 0  . LISINOPRIL 10 MG PO TABS Oral Take 10 mg by mouth daily.      Marland Kitchen ONDANSETRON 4 MG PO TBDP Oral Take 4 mg by mouth every 8 (eight) hours as needed. For nausea     . POLYETHYLENE GLYCOL 3350 PO POWD Oral Take 17 g by mouth daily.      Marland Kitchen PROMETHAZINE HCL 25 MG PO TABS Oral Take 25 mg by mouth 2 (two) times  daily as needed. For nausea     . SIMVASTATIN 20 MG PO TABS Oral Take 20 mg by mouth at bedtime.      . TRIAMTERENE-HCTZ 37.5-25 MG PO CAPS Oral Take 1 capsule by mouth every morning.        BP 156/68  Pulse 75  Temp(Src) 97.4 F (36.3 C) (Oral)  Resp 16  SpO2 98%  Physical Exam  Nursing note and vitals reviewed. Constitutional: She appears well-developed and well-nourished.  HENT:  Head: Normocephalic and atraumatic.  Eyes: Conjunctivae are normal. Pupils are equal, round, and reactive to  light.  Neck: Trachea normal, normal range of motion and full passive range of motion without pain. Neck supple.  Cardiovascular: Normal rate, regular rhythm and normal pulses.   Pulmonary/Chest: Effort normal and breath sounds normal. Chest wall is not dull to percussion. She exhibits no tenderness, no crepitus, no edema, no deformity and no retraction.  Abdominal: Soft. Normal appearance and bowel sounds are normal. She exhibits no distension and no mass. There is no tenderness. There is no rigidity, no rebound, no guarding, no CVA tenderness, no tenderness at McBurney's point and negative Murphy's sign.  Musculoskeletal: Normal range of motion.  Neurological: She is alert. She has normal strength.  Skin: Skin is warm, dry and intact.  Psychiatric: She has a normal mood and affect. Her speech is normal and behavior is normal. Judgment and thought content normal. Cognition and memory are normal.    ED Course  Procedures (including critical care time)   Labs Reviewed  CBC   No results found.   No diagnosis found.    MDM  Pt given fleet Enema in ED and passed bowel movement while in ED. Will be sent home with another Enema.        Dorthula Matas, PA 05/07/11 2255

## 2011-05-07 NOTE — ED Notes (Signed)
COnstipation problems for 4 weeks.  Pt has been having no stool for 4 days.  BLeeding with stools and with urination.

## 2011-05-07 NOTE — ED Notes (Signed)
Pt is deaf, Diplomatic Services operational officer notified to call translator.  Written communication with pt at this time.

## 2011-05-07 NOTE — Telephone Encounter (Signed)
Patient has a history with Dr Juanda Chance.  She is c/o abdominal pain, constipation and incomplete evacuation.  She is deaf and will need to have an interpreter.  This appt is scheduled with her neighbor.  New patient letter and paperwork sent

## 2011-05-07 NOTE — ED Notes (Signed)
Pt NAD, resp e/u, AOx4, ambulatory with steady gait, states understanding of discharge instructions and denies questions at time of discharge.

## 2011-05-07 NOTE — ED Notes (Signed)
Interpreter Eliseo Squires to be arriving at ED at 5:10 pm.

## 2011-05-07 NOTE — ED Notes (Signed)
Pt to ED for eval of lower abd pain, constipation, rectal bleeding; pt reports that she started to have cramping in lower abd about 1pm today and felt like she had to have a BM; pt reports that she tried to have BM, and nothing came out; pt reports that she tried drinking water and coffee to try and assist with having BM with no relief; pt reports that she still feels constipated and pressure; pt reports that when she urinates and has BM she notices blood; assessment done with assistance of sign lang. interpreter

## 2011-05-07 NOTE — ED Notes (Signed)
Called for sign language interpreter for pt.  Awaiting call back with confirmation that one is en route to hospital.

## 2011-05-08 NOTE — ED Provider Notes (Signed)
Medical screening examination/treatment/procedure(s) were performed by non-physician practitioner and as supervising physician I was immediately available for consultation/collaboration.  Ethelda Chick, MD 05/08/11 [phone]

## 2011-05-12 ENCOUNTER — Encounter: Payer: Self-pay | Admitting: Internal Medicine

## 2011-05-12 ENCOUNTER — Ambulatory Visit (INDEPENDENT_AMBULATORY_CARE_PROVIDER_SITE_OTHER): Payer: Medicaid Other | Admitting: Internal Medicine

## 2011-05-12 DIAGNOSIS — K5900 Constipation, unspecified: Secondary | ICD-10-CM

## 2011-05-12 DIAGNOSIS — R1033 Periumbilical pain: Secondary | ICD-10-CM

## 2011-05-12 DIAGNOSIS — K5901 Slow transit constipation: Secondary | ICD-10-CM

## 2011-05-12 DIAGNOSIS — K625 Hemorrhage of anus and rectum: Secondary | ICD-10-CM

## 2011-05-12 MED ORDER — POLYETHYLENE GLYCOL 3350 17 GM/SCOOP PO POWD: 17.0000 g | Freq: Every day | ORAL | Status: DC

## 2011-05-12 MED ORDER — DICYCLOMINE HCL 20 MG PO TABS: 20.0000 mg | ORAL_TABLET | Freq: Three times a day (TID) | ORAL | Status: DC

## 2011-05-12 MED ORDER — PEG-KCL-NACL-NASULF-NA ASC-C 100 G PO SOLR: 1.0000 | Freq: Once | ORAL | Status: DC

## 2011-05-12 NOTE — Progress Notes (Signed)
Kara Saunders 08/12/1950 MRN [id number]    History of Present Illness:  This is a 60 year old female with periumbilical cramping, abdominal pain for which she was evaluated on at least 3 occasions recently. The pain bothers her at night as well as at the end of the day. She has severe constipation. She used to have bowel movements every other day but now has to use laxatives. A CT scan of the abdomen in April 2011 showed her to be status post cholecystectomy and hysterectomy. She has never had a colonoscopy. There is a small amount of bright red blood on the toilet tissue when she wipes. There is no family history of colon cancer.   Past Medical History  Diagnosis Date  . Hypertension   . High cholesterol   . Deaf   . Cardiomegaly   . Arthritis    Past Surgical History  Procedure Date  . Inguinal hernia repair   . Abdominal hysterectomy   . Foot surgery     bilateral  . Tumor excision     finger    reports that she has never smoked. She has never used smokeless tobacco. She reports that she uses illicit drugs (Marijuana). She reports that she does not drink alcohol. family history includes Hypertension in her paternal grandmother; Prostate cancer in her father; Stomach cancer in her paternal uncle; and Stroke in her father and paternal uncle. No Known Allergies      Review of Systems: Denies heartburn, chest pain or shortness of breath  The remainder of the 10 point ROS is negative except as outlined in H&P   Physical Exam: General appearance  Well developed, in no distress., She is deaf. She speaks sign language Eyes- non icteric. HEENT nontraumatic, normocephalic. Mouth no lesions, tongue papillated, no cheilosis. Neck supple without adenopathy, thyroid not enlarged, no carotid bruits, no JVD. Lungs Clear to auscultation bilaterally. Cor normal S1, normal S2, regular rhythm, no murmur,  quiet precordium. Abdomen: Soft relaxed nontender abdomen with normal active  bowel sounds. Liver edge at costal margin. No palpable mass.  Rectal: Small amount or brown Hemoccult negative stool. Extremities no pedal edema. Skin no lesions. Neurological alert and oriented x 3. Psychological normal mood and affect.  Assessment and Plan:  Problem #36 59 year old female with new onset constipation and crampy abdominal pain. I would consider functional low transit constipation. We need to rule out organic disease such as a diverticular stricture, colon obstruction or redundant colon. She is Hemoccult negative on my exam. She denies any upper GI symptoms. There are no constitutional symptoms of weight loss, fever or chills. We will proceed with a colonoscopy after the prep. This was discussed with the patient as well as with her interpreter.   05/12/2011 [name]

## 2011-05-12 NOTE — Patient Instructions (Addendum)
You have been scheduled for a colonoscopy with propofol. Please follow written instructions given to you at your visit today.  Please pick up your prep kit at the pharmacy within the next 2-3 days. We have given you samples of Miralax to take 1 capful (17 grams) dissolved in at least 8 ounces water/juice and drink once daily. We have sent the following medications to your pharmacy for you to pick up at your convenience: Bentyl 20 mg. Take 1 tablet by mouth every 8 hours as needed for crampy abdominal pain. CC: Dr Georganna Skeans

## 2011-05-15 ENCOUNTER — Ambulatory Visit (AMBULATORY_SURGERY_CENTER): Payer: Medicaid Other | Admitting: Internal Medicine

## 2011-05-15 ENCOUNTER — Encounter: Payer: Self-pay | Admitting: Internal Medicine

## 2011-05-15 VITALS — BP 133/77 | HR 69 | Temp 97.5°F | Ht 63.0 in | Wt 157.0 lb

## 2011-05-15 DIAGNOSIS — D126 Benign neoplasm of colon, unspecified: Secondary | ICD-10-CM

## 2011-05-15 DIAGNOSIS — K5900 Constipation, unspecified: Secondary | ICD-10-CM

## 2011-05-15 DIAGNOSIS — Z1211 Encounter for screening for malignant neoplasm of colon: Secondary | ICD-10-CM

## 2011-05-15 LAB — GLUCOSE, CAPILLARY: Glucose-Capillary: 94 mg/dL (ref 70–99)

## 2011-05-15 MED ORDER — POLYETHYLENE GLYCOL 3350 17 GM/SCOOP PO POWD: 17.0000 g | Freq: Every day | ORAL | Status: DC

## 2011-05-15 MED ORDER — SODIUM CHLORIDE 0.9 % IV SOLN: 500.0000 mL | INTRAVENOUS | Status: DC

## 2011-05-15 NOTE — Progress Notes (Signed)
Propofol was administered by Shon Hough, CRNA. Maw  Pt is deaf and has a sign language interpreter with her.  The interpreter was brought into the procedure room and her explained what we going to do for the colonoscopy.  The pt had no questions.  The interpreter pertisipated in the time out also.  The interpreter was excorted into the waiting area after the pt was asleep. Maw  The pt tolerated the colonoscopy very well. maw

## 2011-05-15 NOTE — Progress Notes (Signed)
Patient did not experience any of the following events: a burn prior to discharge; a fall within the facility; wrong site/side/patient/procedure/implant event; or a hospital transfer or hospital admission upon discharge from the facility. (G8907) Patient did not have preoperative order for IV antibiotic SSI prophylaxis. (G8918)  

## 2011-05-15 NOTE — Patient Instructions (Signed)
Discharge instructions given with verbal understanding. Handouts on polyps and constipation given. Resume previous medications.

## 2011-05-15 NOTE — Op Note (Addendum)
[facility] [address] [place], [place]  [address]  COLONOSCOPY PROCEDURE REPORT  PATIENT:  Kara Saunders, Kara Saunders  MR#:  [id number] BIRTHDATE:  1950-09-30, 60 yrs. old  GENDER:  female ENDOSCOPIST:  Hedwig Morton. Juanda Chance, MD REF. BY:  Dr Georganna Skeans PROCEDURE DATE:  05/15/2011 PROCEDURE:  Colonoscopy with biopsy ASA CLASS:  Class II INDICATIONS:  Abdominal pain, hematochezia, constipation, Screening MEDICATIONS:   MAC sedation, administered by CRNA, propofol (Diprivan) 250 mg  DESCRIPTION OF PROCEDURE:   After the risks and benefits and of the procedure were explained, informed consent was obtained. Digital rectal exam was performed and revealed no rectal masses. The LB [id number]-[id number] endoscope was introduced through the anus and advanced to the cecum, which was identified by both the appendix and ileocecal valve.  The quality of the prep was poor, using MoviPrep.  The instrument was then slowly withdrawn as the colon was fully examined. <<PROCEDUREIMAGES>>  FINDINGS:  There were multiple polyps identified and removed. about 8 diminutive polyps 20-25 cm,2-5 mm in diameter The polyps were removed using cold biopsy forceps (see image5, image6, image7, and image8).  This was otherwise a normal examination of the colon (see image9, image4, image3, image2, and image1). suboptimal prep   Retroflexed views in the rectum revealed no abnormalities.    The scope was then withdrawn from the patient and the procedure completed.  COMPLICATIONS:  None ENDOSCOPIC IMPRESSION: 1) Polyps, multiple 2) Otherwise normal examination suboptimal prep, suspect constipation is causing the abd. pain RECOMMENDATIONS: 1) Await pathology results 2) High fiber diet. Miralax 17 gm daily or prn  REPEAT EXAM:  In 5 year(s) for.  ______________________________ Hedwig Morton. Juanda Chance, MD  CC:  n. REVISED:  05/15/2011 02:08 PM eSIGNED:   Hedwig Morton. Marjory Meints at 05/15/2011 02:08 PM  Earna Coder,  [phone]

## 2011-05-16 ENCOUNTER — Telehealth: Payer: Self-pay

## 2011-05-16 NOTE — Telephone Encounter (Signed)
Left message on answering machine. 

## 2011-05-20 ENCOUNTER — Encounter: Payer: Self-pay | Admitting: Internal Medicine

## 2011-12-09 ENCOUNTER — Other Ambulatory Visit (HOSPITAL_COMMUNITY): Payer: Self-pay | Admitting: Family Medicine

## 2011-12-09 DIAGNOSIS — Z1231 Encounter for screening mammogram for malignant neoplasm of breast: Secondary | ICD-10-CM

## 2012-01-15 ENCOUNTER — Encounter (HOSPITAL_COMMUNITY): Payer: Self-pay

## 2012-01-15 ENCOUNTER — Emergency Department (HOSPITAL_COMMUNITY)
Admission: EM | Admit: 2012-01-15 | Discharge: 2012-01-15 | Disposition: A | Discharge status: Home / Self Care | Payer: Medicaid Other | Attending: Emergency Medicine | Admitting: Emergency Medicine

## 2012-01-15 DIAGNOSIS — H5710 Ocular pain, unspecified eye: Secondary | ICD-10-CM | POA: Insufficient documentation

## 2012-01-15 DIAGNOSIS — E7800 Pure hypercholesterolemia, unspecified: Secondary | ICD-10-CM | POA: Insufficient documentation

## 2012-01-15 DIAGNOSIS — I10 Essential (primary) hypertension: Secondary | ICD-10-CM | POA: Insufficient documentation

## 2012-01-15 DIAGNOSIS — H1189 Other specified disorders of conjunctiva: Secondary | ICD-10-CM

## 2012-01-15 DIAGNOSIS — M129 Arthropathy, unspecified: Secondary | ICD-10-CM | POA: Insufficient documentation

## 2012-01-15 MED ORDER — POLYMYXIN B-TRIMETHOPRIM 10000-0.1 UNIT/ML-% OP SOLN: 1.0000 [drp] | OPHTHALMIC | Status: AC

## 2012-01-15 NOTE — ED Notes (Signed)
Per sign language interpretor - the pt c/o watery drainage and left eye pain since Sunday. Pt went to eye doctor on Monday and was given Zylet eye drops, pt reports she has been using them. Pt reports she has gotten no relief with the eye drops and wanted to come here to get help with the eye drainage.

## 2012-01-15 NOTE — ED Notes (Signed)
Pt presents with 5 day h/o L eye redness and pain.  Pt seen at [facility] clinic, placed on Zylet drops which have not helped.  Pt denies any vision change, reports symptoms are worse.  Pt does not wear contacts, reports redness to R eye as well.

## 2012-01-15 NOTE — ED Provider Notes (Signed)
History  Scribed for Kara Kras, MD, the patient was seen in room TR02C/TR02C. This chart was scribed by Candelaria Stagers. The patient's care started at 4:22 PM   CSN: [id number]  Arrival date & time 01/15/12  1447   First MD Initiated Contact with Patient 01/15/12 1537      Chief Complaint  Patient presents with  . Eye Pain     The history is provided by the patient.   Kara Saunders is a 61 y.o. female who presents to the Emergency Department complaining of left eye redness, drainage, pain, and itching that started about five days ago.  Pt was seen at [facility] and prescribed Zylet which has not helped.  She reports that the sx are worse.  She denies vision changes.  Nothing seems to make the sx better. She denies any fevers chills. She denies any contact with chemicals or other substances       Past Medical History  Diagnosis Date  . Hypertension   . High cholesterol   . Deaf   . Cardiomegaly   . Arthritis     Past Surgical History  Procedure Date  . Inguinal hernia repair   . Abdominal hysterectomy   . Foot surgery     bilateral  . Tumor excision     finger    Family History  Problem Relation Age of Onset  . Stroke Father   . Hypertension Paternal Grandmother   . Prostate cancer Father   . Stroke Paternal Uncle   . Stomach cancer Paternal Uncle     History  Substance Use Topics  . Smoking status: Never Smoker   . Smokeless tobacco: Never Used  . Alcohol Use: No    OB History    Grav Para Term Preterm Abortions TAB SAB Ect Mult Living                  Review of Systems  Constitutional: Negative for fever.       10 Systems reviewed and are negative for acute change except as noted in the HPI.  HENT: Negative for congestion.   Eyes: Positive for pain (left), discharge (left), redness (left) and itching (left).  Respiratory: Negative for cough.   Cardiovascular: Negative for chest pain.  Gastrointestinal: Negative for vomiting.    Musculoskeletal: Negative for back pain.  Skin: Negative for rash.  Neurological: Negative for syncope, numbness and headaches.  Psychiatric/Behavioral:       No behavior change.    Allergies  Review of patient's allergies indicates no known allergies.  Home Medications   Current Outpatient Rx  Name Route Sig Dispense Refill  . LISINOPRIL 10 MG PO TABS Oral Take 10 mg by mouth daily.      Marland Kitchen LOTEPREDNOL-TOBRAMYCIN 0.5-0.3 % OP SUSP Right Eye Place 1 drop into the right eye 4 (four) times daily.    Marland Kitchen SIMVASTATIN 20 MG PO TABS Oral Take 20 mg by mouth every evening.       BP 153/61  Pulse 90  Temp 98.8 F (37.1 C) (Oral)  Resp 14  Ht 5\' 4"  (1.626 m)  Wt 150 lb (68.04 kg)  BMI 25.75 kg/m2  SpO2 97%  Physical Exam  Nursing note and vitals reviewed. Constitutional: She appears well-developed and well-nourished. No distress.  HENT:  Head: Normocephalic and atraumatic.  Right Ear: External ear normal.  Left Ear: External ear normal.  Eyes: EOM are normal. Pupils are equal, round, and reactive to light. Right  conjunctiva is injected. Right conjunctiva has no hemorrhage. Left conjunctiva is injected. Left conjunctiva has no hemorrhage. Right eye exhibits normal extraocular motion. Left eye exhibits normal extraocular motion.  Fundoscopic exam:      The right eye shows no hemorrhage.       The left eye shows no hemorrhage.       Erythematous around the inferior and superior left eyelid.   Neck: Neck supple. No tracheal deviation present.  Cardiovascular: Normal rate.   Pulmonary/Chest: Effort normal. No stridor. No respiratory distress.  Musculoskeletal: She exhibits no edema.  Neurological: She is alert. Cranial nerve deficit: no gross deficits.  Skin: Skin is warm and dry. No rash noted.  Psychiatric: She has a normal mood and affect.    ED Course  Procedures   DIAGNOSTIC STUDIES: Oxygen Saturation is 97% on room air, normal by my interpretation.    COORDINATION OF  CARE:     Labs Reviewed - No data to display No results found.   1. Conjunctival irritation       MDM  Patient presents with symptoms consistent with conjunctivitis. There is no evidence to suggest any inflammation in the anterior chamber. She denies any blurred vision. Patient tried to followup with the ophthalmologist but was unable to get in touch with them yesterday or today so that is why she decided to come to the emergency room. I suspect she may have a conjunctivitis possibly keratitis. I will give her the name of an ophthalmologist for further evaluation. I personally performed the services described in this documentation, which was scribed in my presence.  The recorded information has been reviewed and considered.   Kara Kras, MD 01/15/12 [phone]

## 2012-01-15 NOTE — ED Notes (Signed)
Pt requesting sign language interceptor. Sign language communication has been called, they stated they would find someone and call back shortly.

## 2012-03-10 ENCOUNTER — Ambulatory Visit (HOSPITAL_COMMUNITY): Payer: Medicaid Other | Attending: Family Medicine

## 2012-05-31 ENCOUNTER — Ambulatory Visit (HOSPITAL_COMMUNITY): Payer: Medicaid Other | Attending: Family Medicine

## 2012-08-05 ENCOUNTER — Ambulatory Visit (HOSPITAL_COMMUNITY)
Admission: RE | Admit: 2012-08-05 | Discharge: 2012-08-05 | Disposition: A | Discharge status: Home / Self Care | Payer: Medicaid Other | Source: Ambulatory Visit | Attending: Family Medicine | Admitting: Family Medicine

## 2012-08-05 DIAGNOSIS — Z1231 Encounter for screening mammogram for malignant neoplasm of breast: Secondary | ICD-10-CM | POA: Insufficient documentation

## 2012-08-21 ENCOUNTER — Encounter (HOSPITAL_COMMUNITY): Payer: Self-pay | Admitting: Emergency Medicine

## 2012-08-21 ENCOUNTER — Emergency Department (HOSPITAL_COMMUNITY)
Admission: EM | Admit: 2012-08-21 | Discharge: 2012-08-21 | Disposition: A | Discharge status: Home / Self Care | Payer: Medicaid Other | Attending: Emergency Medicine | Admitting: Emergency Medicine

## 2012-08-21 DIAGNOSIS — R42 Dizziness and giddiness: Secondary | ICD-10-CM | POA: Insufficient documentation

## 2012-08-21 DIAGNOSIS — G8929 Other chronic pain: Secondary | ICD-10-CM | POA: Insufficient documentation

## 2012-08-21 DIAGNOSIS — I10 Essential (primary) hypertension: Secondary | ICD-10-CM | POA: Insufficient documentation

## 2012-08-21 DIAGNOSIS — Z79899 Other long term (current) drug therapy: Secondary | ICD-10-CM | POA: Insufficient documentation

## 2012-08-21 DIAGNOSIS — Z9181 History of falling: Secondary | ICD-10-CM | POA: Insufficient documentation

## 2012-08-21 DIAGNOSIS — H913 Deaf nonspeaking, not elsewhere classified: Secondary | ICD-10-CM | POA: Insufficient documentation

## 2012-08-21 DIAGNOSIS — Z8739 Personal history of other diseases of the musculoskeletal system and connective tissue: Secondary | ICD-10-CM | POA: Insufficient documentation

## 2012-08-21 DIAGNOSIS — Z760 Encounter for issue of repeat prescription: Secondary | ICD-10-CM | POA: Insufficient documentation

## 2012-08-21 DIAGNOSIS — Z8679 Personal history of other diseases of the circulatory system: Secondary | ICD-10-CM | POA: Insufficient documentation

## 2012-08-21 DIAGNOSIS — E7800 Pure hypercholesterolemia, unspecified: Secondary | ICD-10-CM | POA: Insufficient documentation

## 2012-08-21 DIAGNOSIS — M79609 Pain in unspecified limb: Secondary | ICD-10-CM | POA: Insufficient documentation

## 2012-08-21 MED ORDER — IBUPROFEN 600 MG PO TABS: 600.0000 mg | ORAL_TABLET | Freq: Three times a day (TID) | ORAL | Status: DC | PRN

## 2012-08-21 MED ORDER — LISINOPRIL 10 MG PO TABS: 10.0000 mg | ORAL_TABLET | Freq: Every day | ORAL | Status: AC

## 2012-08-21 NOTE — ED Provider Notes (Signed)
Medical screening examination/treatment/procedure(s) were performed by non-physician practitioner and as supervising physician I was immediately available for consultation/collaboration.    [name], MD 08/21/12 1400

## 2012-08-21 NOTE — ED Notes (Signed)
Pt is hearing impaired. Interpreter services have been notified. Pt c/o elevated BP and pain to Right leg. Pt reports pain in leg ongoing for 10-14 years.

## 2012-08-21 NOTE — ED Provider Notes (Signed)
History     CSN: [id number]  Arrival date & time 08/21/12  7829   First MD Initiated Contact with Patient 08/21/12 [phone]      Chief Complaint  Patient presents with  . Hypertension  . Leg Pain    right leg    (Consider location/radiation/quality/duration/timing/severity/associated sxs/prior treatment) HPI Comments: Patient with history of HTN reports she was a [facility] patient and has not been seen by her primary care provider since the clinic closed.  She comes in today because she is out of her blood pressure medication (lisinopril) and knows she needs to have it checked, is aware of the risks of long term hypertension.  States she feels well, though she is somewhat lightheaded in the mornings - improved with eating.  States she also has had right thigh pain since surgery on her feet and fall in 2003.  States the pain in her leg is throbbing/pulling, intermittent, worse with bending over.  She has had no recent change in the pain, no recent falls or other injury.  Takes aleve with moderate relief.   States she is going to get a job and wanted to have it checked to make sure she wasn't going to fall down while working.  Denies weakness or numbness of the leg.  Denies fevers, dizziness, blurry vision, balance problems, CP, SOB.  Denies joint pain or swelling.  The history is provided by the patient.    Past Medical History  Diagnosis Date  . Hypertension   . High cholesterol   . Deaf   . Cardiomegaly   . Arthritis     Past Surgical History  Procedure Laterality Date  . Inguinal hernia repair    . Abdominal hysterectomy    . Foot surgery      bilateral  . Tumor excision      finger    Family History  Problem Relation Age of Onset  . Stroke Father   . Hypertension Paternal Grandmother   . Prostate cancer Father   . Stroke Paternal Uncle   . Stomach cancer Paternal Uncle     History  Substance Use Topics  . Smoking status: Never Smoker   . Smokeless tobacco:  Never Used  . Alcohol Use: No    OB History   Grav Para Term Preterm Abortions TAB SAB Ect Mult Living                  Review of Systems  Constitutional: Negative for fever and chills.  Respiratory: Negative for shortness of breath.   Cardiovascular: Negative for chest pain.  Gastrointestinal: Negative for nausea, vomiting, abdominal pain and diarrhea.  Musculoskeletal: Negative for back pain, joint swelling and arthralgias.  Skin: Negative for color change and wound.  Neurological: Positive for light-headedness. Negative for dizziness, weakness and numbness.    Allergies  Review of patient's allergies indicates no known allergies.  Home Medications   Current Outpatient Rx  Name  Route  Sig  Dispense  Refill  . lisinopril (PRINIVIL,ZESTRIL) 10 MG tablet   Oral   Take 10 mg by mouth daily.           . simvastatin (ZOCOR) 20 MG tablet   Oral   Take 20 mg by mouth every evening.            BP 148/62  Pulse 75  Temp(Src) 97.9 F (36.6 C) (Oral)  Resp 20  SpO2 96%  Physical Exam  Nursing note and vitals reviewed. Constitutional: She  appears well-developed and well-nourished. No distress.  HENT:  Head: Normocephalic and atraumatic.  Neck: Neck supple.  Cardiovascular: Normal rate and regular rhythm.   Pulmonary/Chest: Effort normal and breath sounds normal. No respiratory distress. She has no wheezes. She has no rales.  Musculoskeletal: Normal range of motion. She exhibits no edema and no tenderness.       Right hip: She exhibits normal range of motion, normal strength, no tenderness, no bony tenderness, no swelling, no crepitus and no deformity.  Pain reproduced with active ROM of right hip.   Lower extremities:  Strength 5/5 throughout, sensation intact, distal pulses intact.     Neurological: She is alert.  Skin: She is not diaphoretic.    ED Course  Procedures (including critical care time)  Labs Reviewed - No data to display No results  found.  9:18 AM Patient is in fast track, has been greeted by staff.  Pt is hearing impaired and unable to communicate well without interpreter, who has been called.    1. Chronic leg pain, right   2. Hypertension     MDM  Pt with chronic HTN, today 148/62, requesting refill of lisinopril 10mg  (brought in empty pill vial).  Pt without signs of end organ damage and blood pressure not terribly concerning today.  I have refilled pt's lisinopril and encouraged close follow up.  Right thigh pain is likely referred hip pain.  Pt is afebrile, hip exam is normal with exception of pain.  Gait is normal.  Pain has been present and unchanged x 11 years.  Doubt septic joint.  Likely arthritis.  Pt d/c home with NSAIDs, resources for follow up.  Return precautions given. Pt verbalizes understanding and agrees with plan.          Trixie Dredge, PA-C 08/21/12 1000

## 2013-03-16 ENCOUNTER — Ambulatory Visit (HOSPITAL_COMMUNITY)
Admission: RE | Admit: 2013-03-16 | Discharge: 2013-03-16 | Disposition: A | Discharge status: Home / Self Care | Payer: Medicaid Other | Source: Ambulatory Visit | Attending: Internal Medicine | Admitting: Internal Medicine

## 2013-03-16 ENCOUNTER — Other Ambulatory Visit (HOSPITAL_COMMUNITY): Payer: Self-pay | Admitting: Internal Medicine

## 2013-03-16 DIAGNOSIS — M1610 Unilateral primary osteoarthritis, unspecified hip: Secondary | ICD-10-CM | POA: Insufficient documentation

## 2013-03-16 DIAGNOSIS — R52 Pain, unspecified: Secondary | ICD-10-CM

## 2013-03-16 DIAGNOSIS — N949 Unspecified condition associated with female genital organs and menstrual cycle: Secondary | ICD-10-CM | POA: Insufficient documentation

## 2013-03-16 DIAGNOSIS — M169 Osteoarthritis of hip, unspecified: Secondary | ICD-10-CM | POA: Insufficient documentation

## 2013-08-29 ENCOUNTER — Other Ambulatory Visit (HOSPITAL_COMMUNITY): Payer: Self-pay | Admitting: Diagnostic Radiology

## 2013-08-29 DIAGNOSIS — Z1231 Encounter for screening mammogram for malignant neoplasm of breast: Secondary | ICD-10-CM

## 2013-09-09 ENCOUNTER — Ambulatory Visit (HOSPITAL_COMMUNITY): Payer: Medicaid Other

## 2013-09-12 ENCOUNTER — Ambulatory Visit (HOSPITAL_COMMUNITY)
Admission: RE | Admit: 2013-09-12 | Discharge: 2013-09-12 | Disposition: A | Discharge status: Home / Self Care | Payer: Medicaid Other | Source: Ambulatory Visit | Attending: Diagnostic Radiology | Admitting: Diagnostic Radiology

## 2013-09-12 DIAGNOSIS — Z1231 Encounter for screening mammogram for malignant neoplasm of breast: Secondary | ICD-10-CM

## 2013-09-19 ENCOUNTER — Other Ambulatory Visit (HOSPITAL_COMMUNITY): Payer: Self-pay | Admitting: Internal Medicine

## 2013-09-19 DIAGNOSIS — Z1231 Encounter for screening mammogram for malignant neoplasm of breast: Secondary | ICD-10-CM

## 2013-10-17 ENCOUNTER — Ambulatory Visit (HOSPITAL_COMMUNITY): Payer: Medicaid Other

## 2014-01-23 ENCOUNTER — Ambulatory Visit (HOSPITAL_COMMUNITY): Payer: Medicaid Other | Attending: Internal Medicine

## 2014-02-16 ENCOUNTER — Encounter (HOSPITAL_COMMUNITY): Payer: Self-pay | Admitting: Emergency Medicine

## 2014-02-16 ENCOUNTER — Emergency Department (HOSPITAL_COMMUNITY)
Admission: EM | Admit: 2014-02-16 | Discharge: 2014-02-16 | Disposition: A | Discharge status: Home / Self Care | Payer: Medicaid Other | Attending: Emergency Medicine | Admitting: Emergency Medicine

## 2014-02-16 DIAGNOSIS — Z79899 Other long term (current) drug therapy: Secondary | ICD-10-CM | POA: Diagnosis not present

## 2014-02-16 DIAGNOSIS — E7800 Pure hypercholesterolemia, unspecified: Secondary | ICD-10-CM | POA: Diagnosis not present

## 2014-02-16 DIAGNOSIS — M129 Arthropathy, unspecified: Secondary | ICD-10-CM | POA: Diagnosis not present

## 2014-02-16 DIAGNOSIS — I10 Essential (primary) hypertension: Secondary | ICD-10-CM | POA: Insufficient documentation

## 2014-02-16 DIAGNOSIS — M25569 Pain in unspecified knee: Secondary | ICD-10-CM | POA: Diagnosis not present

## 2014-02-16 DIAGNOSIS — R52 Pain, unspecified: Secondary | ICD-10-CM | POA: Insufficient documentation

## 2014-02-16 DIAGNOSIS — H9190 Unspecified hearing loss, unspecified ear: Secondary | ICD-10-CM | POA: Insufficient documentation

## 2014-02-16 DIAGNOSIS — M25561 Pain in right knee: Secondary | ICD-10-CM

## 2014-02-16 MED ORDER — MELOXICAM 15 MG PO TABS: 15.0000 mg | ORAL_TABLET | Freq: Every day | ORAL | Status: DC

## 2014-02-16 NOTE — ED Provider Notes (Signed)
CSN: [id number]     Arrival date & time 02/16/14  1219 History  This chart was scribed for non-physician practitioner Margarita Mail working with No att. providers found by Donato Schultz, ED Scribe. This patient was seen in room TR10C/TR10C and the patient's care was started at 2:07 PM.      Chief Complaint  Patient presents with  . Knee Pain    Patient is a 63 y.o. female presenting with knee pain. The history is provided by the patient. A language interpreter was used.  Knee Pain Associated symptoms: no fever    HPI Comments: Kara Saunders is a 63 y.o. female who presents to the Emergency Department complaining of constant right knee pain with associated swelling and warmth that started this morning when she woke up.  Walking and movement aggravate her pain.  She denies fever, nausea, vomiting, and chills as associated symptoms.  She fell in the shower and landed on her knees in 2008 and has had knee pain ever since.  She does not have a history of gout.  She does not have an orthopedic doctor.  Her PCP is Dr. Marlou Sa.  Her has a history of hypertension but states that it is controlled with her medication.  Past Medical History  Diagnosis Date  . Hypertension   . High cholesterol   . Deaf   . Cardiomegaly   . Arthritis    Past Surgical History  Procedure Laterality Date  . Inguinal hernia repair    . Abdominal hysterectomy    . Foot surgery      bilateral  . Tumor excision      finger   Family History  Problem Relation Age of Onset  . Stroke Father   . Hypertension Paternal Grandmother   . Prostate cancer Father   . Stroke Paternal Uncle   . Stomach cancer Paternal Uncle    History  Substance Use Topics  . Smoking status: Never Smoker   . Smokeless tobacco: Never Used  . Alcohol Use: No   OB History   Grav Para Term Preterm Abortions TAB SAB Ect Mult Living                 Review of Systems  Constitutional: Negative for fever and chills.  Gastrointestinal:  Negative for nausea and vomiting.  Musculoskeletal: Positive for arthralgias and joint swelling.  All other systems reviewed and are negative.     Allergies  Review of patient's allergies indicates no known allergies.  Home Medications   Prior to Admission medications   Medication Sig Start Date End Date Taking? Authorizing Provider  ibuprofen (ADVIL,MOTRIN) 600 MG tablet Take 1 tablet (600 mg total) by mouth every 8 (eight) hours as needed for pain. 08/21/12   Clayton Bibles, PA-C  lisinopril (PRINIVIL,ZESTRIL) 10 MG tablet Take 10 mg by mouth daily.      Historical Provider, MD  lisinopril (PRINIVIL,ZESTRIL) 10 MG tablet Take 1 tablet (10 mg total) by mouth daily. 08/21/12   Clayton Bibles, PA-C  meloxicam (MOBIC) 15 MG tablet Take 1 tablet (15 mg total) by mouth daily. Take 1 daily with food. 02/16/14   Margarita Mail, PA-C  simvastatin (ZOCOR) 20 MG tablet Take 20 mg by mouth every evening.     Historical Provider, MD   BP 166/69  Pulse 73  Temp(Src) 98.7 F (37.1 C) (Oral)  Resp 18  SpO2 100%  Physical Exam  Nursing note and vitals reviewed. Constitutional: She is oriented to person, place, and  time. She appears well-developed and well-nourished.  HENT:  Head: Normocephalic and atraumatic.  Eyes: EOM are normal.  Neck: Normal range of motion.  Cardiovascular: Normal rate.   Pulmonary/Chest: Effort normal.  Musculoskeletal: Normal range of motion.  Right knee - No swelling.  No ecchymosis.  No right knee effusion.  No instability.  No heat or warmth.  Negative McMurray's test.  Negative anterior and posterior drawer.  No known injury.     Neurological: She is alert and oriented to person, place, and time.  Skin: Skin is warm and dry.  Psychiatric: She has a normal mood and affect. Her behavior is normal.    ED Course  Procedures (including critical care time)  DIAGNOSTIC STUDIES: Oxygen Saturation is 100% on room air, normal by my interpretation.    COORDINATION OF  CARE: 2:13 PM- Discussed a clinical suspicion of a patellar tendon tendonitis with the patient.  Advised the patient to follow RICE therapy and follow-up with her PCP or orthopedist.  Will discharge the patient with antiinflammatory medications.  The patient agreed to the treatment plan.   Labs Review Labs Reviewed - No data to display  Imaging Review No results found.   EKG Interpretation None      MDM   Final diagnoses:  Knee pain, acute, right     Pain managed in ED. Pt advised to follow up with orthopedics if symptoms persist for possibility of missed fracture diagnosis. Patient given brace while in ED, conservative therapy recommended and discussed. Patient will be dc home & is agreeable with above plan.   I personally performed the services described in this documentation, which was scribed in my presence. The recorded information has been reviewed and is accurate.      Margarita Mail, PA-C 02/20/14 2209

## 2014-02-16 NOTE — ED Notes (Signed)
Pt states she has had chronic pain in her R knee for the past 2 years.  Pt states she woke up this morning and had intense pain 10/10 and noticed some mild swelling in knee.  She denies recent injury.  She states she has pain that radiates from her knee to her inner thigh (though not often).

## 2014-02-16 NOTE — Discharge Instructions (Signed)
Patellar Tendinitis, Jumper's Knee with Rehab Tendinitis is inflammation of a tendon. Tendonitis of the tendon below the kneecap (patella) is known as patellar tendonitis. Patellar tendonitis is a common cause of pain below the kneecap (infrapatellar). Patellar tendonitis may involve a tear (strain) in the ligament. Strains are classified into three categories. Grade 1 strains cause pain, but the tendon is not lengthened. Grade 2 strains include a lengthened ligament, due to the ligament being stretched or partially ruptured. With grade 2 strains there is still function, although function may be decreased. Grade 3 strains involve a complete tear of the tendon or muscle, and function is usually impaired. Patellar tendon strains are usually grade 1 or 2.  SYMPTOMS   Pain, tenderness, swelling, warmth, or redness over the patellar tendon (just below the kneecap).  Pain and loss of strength (sometimes), with forcefully straightening the knee (especially when jumping or rising from a seated or squatting position), or bending the knee completely (squatting or kneeling).  Crackling sound (crepitation) when the tendon is moved or touched. CAUSES  Patellar tendonitis is caused by injury to the patellar tendon. The inflammation is the body's healing response. Common causes of injury include:  Stress from a sudden increase in intensity, frequency, or duration of training.  Overuse of the thigh muscles (quadriceps) and patellar tendon.  Direct hit (trauma) to the knee or patellar tendon. RISK INCREASES WITH:  Sports that require sudden, explosive quadriceps contraction, such as jumping, quick starts, or kicking.  Running sports, especially running down hills.  Poor strength and flexibility of the thigh and knee.  Flat feet. PREVENTION  Warm up and stretch properly before activity.  Allow for adequate recovery between workouts.  Maintain physical fitness:  Strength, flexibility, and  endurance.  Cardiovascular fitness.  Protect the knee joint with taping, protective strapping, bracing, or elastic compression bandage.  Wear arch supports (orthotics). PROGNOSIS  If treated properly, patellar tendonitis usually heals within 6 weeks.  RELATED COMPLICATIONS   Longer healing time if not properly treated or if not given enough time to heal.  Recurring symptoms if activity is resumed too soon, with overuse, with a direct blow, or when using poor technique.  If untreated, tendon rupture requiring surgery. TREATMENT Treatment first involves the use of ice and medicine to reduce pain and inflammation. The use of strengthening and stretching exercises may help reduce pain with activity. These exercises may be performed at home or with a therapist. Serious cases of tendonitis may require restraining the knee for 10 to 14 days to prevent stress on the tendon and to promote healing. Crutches may be used (uncommon) until you can walk without a limp. For cases in which nonsurgical treatment is unsuccessful, surgery may be advised to remove the inflamed tendon lining (sheath). Surgery is rare, and is only advised after at least 6 months of nonsurgical treatment. MEDICATION   If pain medicine is needed, nonsteroidal anti-inflammatory medicines (aspirin and ibuprofen), or other minor pain relievers (acetaminophen), are often advised.  Do not take pain medicine for 7 days before surgery.  Prescription pain relievers may be given if your caregiver thinks they are needed. Use only as directed and only as much as you need. HEAT AND COLD  Cold treatment (icing) should be applied for 10 to 15 minutes every 2 to 3 hours for inflammation and pain, and immediately after activity that aggravates your symptoms. Use ice packs or an ice massage.  Heat treatment may be used before performing stretching and strengthening activities   prescribed by your caregiver, physical therapist, or athletic trainer.  Use a heat pack or a warm water soak. SEEK MEDICAL CARE IF:  Symptoms get worse or do not improve in 2 weeks, despite treatment.  New, unexplained symptoms develop. (Drugs used in treatment may produce side effects.) EXERCISES RANGE OF MOTION (ROM) AND STRETCHING EXERCISES - Patellar Tendinitis (Jumper's Knee) These are some of the initial exercises with which you may start your rehabilitation program, until you see your caregiver again or until your symptoms are resolved. Remember:   Flexible tissue is more tolerant of the stresses placed on it during activities.  Each stretch should be held for 20 to 30 seconds.  A gentle stretching sensation should be felt. STRETCH - Hamstrings, Supine  Lie on your back. Loop a belt or towel over the ball of your right / left foot.  Straighten your right / left knee and slowly pull on the belt to raise your leg. Do not allow the right / left knee to bend. Keep your opposite leg flat on the floor.  Raise the leg until you feel a gentle stretch behind your right / left knee or thigh. Hold this position for __________ seconds. Repeat __________ times. Complete this stretch __________ times per day.  STRETCH - Hamstrings, Doorway  Lie on your back with your right / left leg extended and resting on the wall, and the opposite leg flat on the ground through the door. At first, position your bottom farther away from the wall.  Keep your right / left knee straight. If you feel a stretch behind your knee or thigh, hold this position for __________ seconds.  If you do not feel a stretch, scoot your bottom closer to the door, and hold __________ seconds. Repeat __________ times. Complete this stretch __________ times per day.  STRETCH - Hamstrings, Standing  Stand or sit and extend your right / left leg, placing your foot on a chair or foot stool.  Keep a slight arch in your low back and your hips straight forward.  Lead with your chest and lean forward  at the waist until you feel a gentle stretch in the back of your right / left knee or thigh. (When done correctly, this exercise requires leaning only a small distance.)  Hold this position for __________ seconds. Repeat __________ times. Complete this stretch __________ times per day. STRETCH - Adductors, Lunge  While standing, spread your legs, with your right / left leg behind you.  Lean away from your right / left leg by bending your opposite knee. You may rest your hands on your thigh for balance.  You should feel a stretch in your right / left inner thigh. Hold for __________ seconds. Repeat __________ times. Complete this exercise __________ times per day.  STRENGTHENING EXERCISES - Patellar Tendinitis (Jumper's Knee) These exercises may help you when beginning to rehabilitate your injury. They may resolve your symptoms with or without further involvement from your physician, physical therapist or athletic trainer. While completing these exercises, remember:   Muscles can gain both the endurance and the strength needed for everyday activities through controlled exercises.  Complete these exercises as instructed by your physician, physical therapist or athletic trainer. Increase the resistance and repetitions only as guided by your caregiver. STRENGTH - Quadriceps, Isometrics  Lie on your back with your right / left leg extended and your opposite knee bent.  Gradually tense the muscles in the front of your right / left thigh. You should see   either your kneecap slide up toward your hip or increased dimpling just above the knee. This motion will push the back of the knee down toward the floor, mat, or bed on which you are lying.  Hold the muscle as tight as you can, without increasing your pain, for __________ seconds.  Relax the muscles slowly and completely in between each repetition. Repeat __________ times. Complete this exercise __________ times per day.  STRENGTH - Quadriceps,  Short Arcs  Lie on your back. Place a __________ inch towel roll under your right / left knee, so that the knee bends slightly.  Raise only your lower leg by tightening the muscles in the front of your thigh. Do not allow your thigh to rise.  Hold this position for __________ seconds. Repeat __________ times. Complete this exercise __________ times per day.  OPTIONAL ANKLE WEIGHTS: Begin with ____________________, but DO NOT exceed ____________________. Increase in 1 pound/ 0.5 kilogram increments. STRENGTH - Quadriceps, Straight Leg Raises  Quality counts! Watch for signs that the quadriceps muscle is working, to be sure you are strengthening the correct muscles and not "cheating" by substituting with healthier muscles.  Lay on your back with your right / left leg extended and your opposite knee bent.  Tense the muscles in the front of your right / left thigh. You should see either your kneecap slide up or increased dimpling just above the knee. Your thigh may even shake a bit.  Tighten these muscles even more and raise your leg 4 to 6 inches off the floor. Hold for __________ seconds.  Keeping these muscles tense, lower your leg.  Relax the muscles slowly and completely between each repetition. Repeat __________ times. Complete this exercise __________ times per day.  STRENGTH - Quadriceps, Squats  Stand in a door frame so that your feet and knees are in line with the frame.  Use your hands for balance, not support, on the frame.  Slowly lower your weight, bending at the hips and knees. Keep your lower legs upright so that they are parallel with the door frame. Squat only within the range that does not increase your knee pain. Never let your hips drop below your knees.  Slowly return upright, pushing with your legs, not pulling with your hands. Repeat __________ times. Complete this exercise __________ times per day.  STRENGTH - Quadriceps, Step-Downs  Stand on the edge of a step  stool or stair. Be prepared to use a countertop or wall for balance, if needed.  Keeping your right / left knee directly over the middle of your foot, slowly touch your opposite heel to the floor or lower step. Do not go all the way to the floor if your knee pain increases; just go as far as you can without increased discomfort. Use your right / left leg muscles, not gravity to lower your body weight.  Slowly push your body weight back up to the starting position. Repeat __________ times. Complete this exercise __________ times per day.  Document Released: 05/26/2005 Document Revised: 10/10/2013 Document Reviewed: 09/07/2008 [employer] Patient Information 2015 [employer]. This information is not intended to replace advice given to you by your health care provider. Make sure you discuss any questions you have with your health care provider.  

## 2014-02-16 NOTE — ED Notes (Signed)
Pt here c/o left leg pain x 2 years that is worse over last couple of days; pt denies obvious injury but sts hx of some falls; pt is deaf and interpretor at bedside

## 2014-02-21 NOTE — ED Provider Notes (Signed)
Medical screening examination/treatment/procedure(s) were performed by non-physician practitioner and as supervising physician I was immediately available for consultation/collaboration.   EKG Interpretation None        Houston Siren III, MD 02/21/14 [phone]

## 2014-04-27 ENCOUNTER — Ambulatory Visit (HOSPITAL_COMMUNITY): Admission: RE | Admit: 2014-04-27 | Payer: Medicaid Other | Source: Ambulatory Visit

## 2014-05-02 ENCOUNTER — Other Ambulatory Visit (HOSPITAL_COMMUNITY): Payer: Self-pay | Admitting: Internal Medicine

## 2014-05-02 DIAGNOSIS — Z1231 Encounter for screening mammogram for malignant neoplasm of breast: Secondary | ICD-10-CM

## 2014-05-03 ENCOUNTER — Emergency Department (HOSPITAL_COMMUNITY)
Admission: EM | Admit: 2014-05-03 | Discharge: 2014-05-03 | Disposition: A | Discharge status: Home / Self Care | Payer: Medicaid Other | Attending: Emergency Medicine | Admitting: Emergency Medicine

## 2014-05-03 ENCOUNTER — Encounter (HOSPITAL_COMMUNITY): Payer: Self-pay | Admitting: Physical Medicine and Rehabilitation

## 2014-05-03 DIAGNOSIS — Z791 Long term (current) use of non-steroidal anti-inflammatories (NSAID): Secondary | ICD-10-CM | POA: Insufficient documentation

## 2014-05-03 DIAGNOSIS — B029 Zoster without complications: Secondary | ICD-10-CM | POA: Diagnosis not present

## 2014-05-03 DIAGNOSIS — E780 Pure hypercholesterolemia: Secondary | ICD-10-CM | POA: Diagnosis not present

## 2014-05-03 DIAGNOSIS — I10 Essential (primary) hypertension: Secondary | ICD-10-CM | POA: Diagnosis not present

## 2014-05-03 DIAGNOSIS — H913 Deaf nonspeaking, not elsewhere classified: Secondary | ICD-10-CM | POA: Diagnosis not present

## 2014-05-03 DIAGNOSIS — Z79899 Other long term (current) drug therapy: Secondary | ICD-10-CM | POA: Insufficient documentation

## 2014-05-03 DIAGNOSIS — Z8739 Personal history of other diseases of the musculoskeletal system and connective tissue: Secondary | ICD-10-CM | POA: Diagnosis not present

## 2014-05-03 DIAGNOSIS — R21 Rash and other nonspecific skin eruption: Secondary | ICD-10-CM | POA: Diagnosis present

## 2014-05-03 MED ORDER — OXYCODONE-ACETAMINOPHEN 5-325 MG PO TABS: 1.0000 | ORAL_TABLET | Freq: Four times a day (QID) | ORAL | Status: DC | PRN

## 2014-05-03 MED ORDER — FAMCICLOVIR 500 MG PO TABS: 500.0000 mg | ORAL_TABLET | Freq: Three times a day (TID) | ORAL | Status: AC

## 2014-05-03 MED ORDER — IBUPROFEN 200 MG PO TABS
400.0000 mg | ORAL_TABLET | Freq: Once | ORAL | Status: AC
  Administered 2014-05-03: 400 mg via ORAL
  Filled 2014-05-03: qty 2

## 2014-05-03 NOTE — ED Provider Notes (Signed)
CSN: [id number]     Arrival date & time 05/03/14  0744 History   First MD Initiated Contact with Patient 05/03/14 0757     Chief Complaint  Patient presents with  . Rash   HPI Kara Saunders is a 63 year old woman with history of deafness, HTN, HLD, and arthritis presenting with a rash. She reports a painful rash on the right side of her chest and back.  She says that the sharp pain started a week ago, and she first noticed the rash 3-4 days ago.  The pain is currently 2/10, but has been as high as 6/10.  She denies any history of a similar rash.  She denies fevers, chills, shortness of breath or chest tightness.  Past Medical History  Diagnosis Date  . Hypertension   . High cholesterol   . Deaf   . Cardiomegaly   . Arthritis    Past Surgical History  Procedure Laterality Date  . Inguinal hernia repair    . Abdominal hysterectomy    . Foot surgery      bilateral  . Tumor excision      finger   Family History  Problem Relation Age of Onset  . Stroke Father   . Hypertension Paternal Grandmother   . Prostate cancer Father   . Stroke Paternal Uncle   . Stomach cancer Paternal Uncle    History  Substance Use Topics  . Smoking status: Never Smoker   . Smokeless tobacco: Never Used  . Alcohol Use: No   OB History    No data available     Review of Systems  Constitutional: Negative for fever and chills.  HENT: Negative for congestion.   Respiratory: Negative for chest tightness and shortness of breath.   Cardiovascular: Negative for palpitations.  Gastrointestinal: Negative for nausea, vomiting, abdominal pain, diarrhea and constipation.  Genitourinary: Negative for difficulty urinating.  Musculoskeletal: Negative for myalgias and arthralgias.  Skin: Positive for rash.  Neurological: Negative for dizziness, speech difficulty and light-headedness.      Allergies  Review of patient's allergies indicates no known allergies.  Home Medications   Prior to Admission  medications   Medication Sig Start Date End Date Taking? Authorizing Provider  cholecalciferol (VITAMIN D) 1000 UNITS tablet Take 1,000 Units by mouth daily.   Yes Historical Provider, MD  lisinopril (PRINIVIL,ZESTRIL) 10 MG tablet Take 1 tablet (10 mg total) by mouth daily. 08/21/12  Yes Clayton Bibles, PA-C  naproxen (NAPROSYN) 375 MG tablet Take 375 mg by mouth 2 (two) times daily as needed (for pain).   Yes Historical Provider, MD  simvastatin (ZOCOR) 20 MG tablet Take 20 mg by mouth every evening.    Yes Historical Provider, MD  triamterene-hydrochlorothiazide (DYAZIDE) 37.5-25 MG per capsule Take 1 capsule by mouth daily.   Yes Historical Provider, MD  ibuprofen (ADVIL,MOTRIN) 600 MG tablet Take 1 tablet (600 mg total) by mouth every 8 (eight) hours as needed for pain. Patient not taking: Reported on 05/03/2014 08/21/12   Clayton Bibles, PA-C  meloxicam (MOBIC) 15 MG tablet Take 1 tablet (15 mg total) by mouth daily. Take 1 daily with food. Patient not taking: Reported on 05/03/2014 02/16/14   Margarita Mail, PA-C   BP 137/53 mmHg  Pulse 64  Temp(Src) 97.5 F (36.4 C) (Oral)  Resp 20  SpO2 100% Physical Exam  Constitutional: She is oriented to person, place, and time. She appears well-developed and well-nourished. No distress.  HENT:  Head: Normocephalic and atraumatic.  Mouth/Throat: No oropharyngeal exudate.  Eyes: Conjunctivae and EOM are normal. Pupils are equal, round, and reactive to light. No scleral icterus.  Cardiovascular: Normal rate, regular rhythm and normal heart sounds.   Pulmonary/Chest: Effort normal and breath sounds normal. No respiratory distress.  Abdominal: Soft. Bowel sounds are normal. She exhibits no distension. There is no tenderness.  Musculoskeletal: Normal range of motion. She exhibits tenderness (In distribution of rash.). She exhibits no edema.  Neurological: She is alert and oriented to person, place, and time. No cranial nerve deficit. She exhibits normal  muscle tone.  Skin: Skin is warm and dry. Rash (Grouped erythematous vesicles in the R T7 dermatome distirubution.) noted.    ED Course  Procedures (including critical care time) Labs Review Labs Reviewed - No data to display  Imaging Review No results found.   EKG Interpretation None      MDM   Final diagnoses:  Shingles   Rash consistent with shingles.  Given her age and rash duration close to 72 hours, will treat with famciclovir to reduce duration of symptoms.  Will give ibuprofen here and a Percocet prescription for pain.   Arman Filter, MD 05/03/14 [phone]

## 2014-05-03 NOTE — ED Notes (Signed)
Pt presents to department for evaluation of rash to R rib cage, flank and back. Ongoing x1 week. Pt reports severe stabbing pain and itching. 6/10 pain at present. NAD.

## 2014-05-03 NOTE — ED Notes (Signed)
Pt states rash to R rib cage, flank and back. Ongoing x1 week. Reports severe discomfort and itching and present. Fluid filled vesicles noted.

## 2014-05-03 NOTE — ED Provider Notes (Signed)
I saw and evaluated the patient, reviewed the resident's note and I agree with the findings and plan.  Classic dermatomal zoster rash right chest wall   Babette Relic, MD 05/04/14 [phone]

## 2014-05-03 NOTE — Discharge Instructions (Signed)
-Your rash is shingles. -I wrote you a prescription for a anti-viral to help the rash heal more quickly. -You can take ibuprofen for the pain. -I also wrote you a prescription for Percocet if you need it.  I would recommend taking it at night time because it may make you drowsy.  Shingles Shingles (herpes zoster) is an infection that is caused by the same virus that causes chickenpox (varicella). The infection causes a painful skin rash and fluid-filled blisters, which eventually break open, crust over, and heal. It may occur in any area of the body, but it usually affects only one side of the body or face. The pain of shingles usually lasts about 1 month. However, some people with shingles may develop long-term (chronic) pain in the affected area of the body. Shingles often occurs many years after the person had chickenpox. It is more common:  In people older than 50 years.  In people with weakened immune systems, such as those with HIV, AIDS, or cancer.  In people taking medicines that weaken the immune system, such as transplant medicines.  In people under great stress. CAUSES  Shingles is caused by the varicella zoster virus (VZV), which also causes chickenpox. After a person is infected with the virus, it can remain in the person's body for years in an inactive state (dormant). To cause shingles, the virus reactivates and breaks out as an infection in a nerve root. The virus can be spread from person to person (contagious) through contact with open blisters of the shingles rash. It will only spread to people who have not had chickenpox. When these people are exposed to the virus, they may develop chickenpox. They will not develop shingles. Once the blisters scab over, the person is no longer contagious and cannot spread the virus to others. SIGNS AND SYMPTOMS  Shingles shows up in stages. The initial symptoms may be pain, itching, and tingling in an area of the skin. This pain is usually  described as burning, stabbing, or throbbing.In a few days or weeks, a painful red rash will appear in the area where the pain, itching, and tingling were felt. The rash is usually on one side of the body in a band or belt-like pattern. Then, the rash usually turns into fluid-filled blisters. They will scab over and dry up in approximately 2-3 weeks. Flu-like symptoms may also occur with the initial symptoms, the rash, or the blisters. These may include:  Fever.  Chills.  Headache.  Upset stomach. DIAGNOSIS  Your health care provider will perform a skin exam to diagnose shingles. Skin scrapings or fluid samples may also be taken from the blisters. This sample will be examined under a microscope or sent to a lab for further testing. TREATMENT  There is no specific cure for shingles. Your health care provider will likely prescribe medicines to help you manage the pain, recover faster, and avoid long-term problems. This may include antiviral drugs, anti-inflammatory drugs, and pain medicines. HOME CARE INSTRUCTIONS   Take a cool bath or apply cool compresses to the area of the rash or blisters as directed. This may help with the pain and itching.   Take medicines only as directed by your health care provider.   Rest as directed by your health care provider.  Keep your rash and blisters clean with mild soap and cool water or as directed by your health care provider.  Do not pick your blisters or scratch your rash. Apply an anti-itch cream or numbing  creams to the affected area as directed by your health care provider.  Keep your shingles rash covered with a loose bandage (dressing).  Avoid skin contact with:  Babies.   Pregnant women.   Children with eczema.   Elderly people with transplants.   People with chronic illnesses, such as leukemia or AIDS.   Wear loose-fitting clothing to help ease the pain of material rubbing against the rash.  Keep all follow-up visits as  directed by your health care provider.If the area involved is on your face, you may receive a referral for a specialist, such as an eye doctor (ophthalmologist) or an ear, nose, and throat (ENT) doctor. Keeping all follow-up visits will help you avoid eye problems, chronic pain, or disability.  SEEK IMMEDIATE MEDICAL CARE IF:   You have facial pain, pain around the eye area, or loss of feeling on one side of your face.  You have ear pain or ringing in your ear.  You have loss of taste.  Your pain is not relieved with prescribed medicines.   Your redness or swelling spreads.   You have more pain and swelling.  Your condition is worsening or has changed.   You have a fever. MAKE SURE YOU:  Understand these instructions.  Will watch your condition.  Will get help right away if you are not doing well or get worse. Document Released: 05/26/2005 Document Revised: 10/10/2013 Document Reviewed: 01/08/2012 [facility] Patient Information 2015 [place], [place]. This information is not intended to replace advice given to you by your health care provider. Make sure you discuss any questions you have with your health care provider.

## 2014-05-16 ENCOUNTER — Ambulatory Visit (HOSPITAL_COMMUNITY): Payer: Medicaid Other

## 2014-05-24 ENCOUNTER — Ambulatory Visit (HOSPITAL_COMMUNITY)
Admission: RE | Admit: 2014-05-24 | Discharge: 2014-05-24 | Disposition: A | Discharge status: Home / Self Care | Payer: Medicaid Other | Source: Ambulatory Visit | Attending: Internal Medicine | Admitting: Internal Medicine

## 2014-05-24 DIAGNOSIS — Z1231 Encounter for screening mammogram for malignant neoplasm of breast: Secondary | ICD-10-CM | POA: Insufficient documentation

## 2016-04-01 ENCOUNTER — Encounter (HOSPITAL_COMMUNITY): Payer: Self-pay

## 2016-04-01 ENCOUNTER — Emergency Department (HOSPITAL_COMMUNITY)
Admission: EM | Admit: 2016-04-01 | Discharge: 2016-04-01 | Disposition: A | Discharge status: Home / Self Care | Payer: Medicaid Other | Attending: Emergency Medicine | Admitting: Emergency Medicine

## 2016-04-01 DIAGNOSIS — I10 Essential (primary) hypertension: Secondary | ICD-10-CM | POA: Insufficient documentation

## 2016-04-01 DIAGNOSIS — K649 Unspecified hemorrhoids: Secondary | ICD-10-CM | POA: Diagnosis not present

## 2016-04-01 DIAGNOSIS — K625 Hemorrhage of anus and rectum: Secondary | ICD-10-CM

## 2016-04-01 LAB — CBC
HCT: 41.9 % (ref 36.0–46.0)
HEMOGLOBIN: 13.9 g/dL (ref 12.0–15.0)
MCH: 30.1 pg (ref 26.0–34.0)
MCHC: 33.2 g/dL (ref 30.0–36.0)
MCV: 90.7 fL (ref 78.0–100.0)
PLATELETS: 312 10*3/uL (ref 150–400)
RBC: 4.62 MIL/uL (ref 3.87–5.11)
RDW: 13 % (ref 11.5–15.5)
WBC: 6.7 10*3/uL (ref 4.0–10.5)

## 2016-04-01 LAB — COMPREHENSIVE METABOLIC PANEL
ALBUMIN: 4 g/dL (ref 3.5–5.0)
ALK PHOS: 73 U/L (ref 38–126)
ALT: 14 U/L (ref 14–54)
ANION GAP: 10 (ref 5–15)
AST: 26 U/L (ref 15–41)
BILIRUBIN TOTAL: 0.5 mg/dL (ref 0.3–1.2)
BUN: 10 mg/dL (ref 6–20)
CALCIUM: 9.6 mg/dL (ref 8.9–10.3)
CO2: 26 mmol/L (ref 22–32)
CREATININE: 0.84 mg/dL (ref 0.44–1.00)
Chloride: 102 mmol/L (ref 101–111)
GFR calc Af Amer: >60 mL/min (ref >60)
GFR calc non Af Amer: >60 mL/min (ref >60)
GLUCOSE: 102 mg/dL — AB (ref 65–99)
Potassium: 4.2 mmol/L (ref 3.5–5.1)
Sodium: 138 mmol/L (ref 135–145)
TOTAL PROTEIN: 7.4 g/dL (ref 6.5–8.1)

## 2016-04-01 NOTE — ED Triage Notes (Signed)
Pt presents for evaluation of blood in stools Friday/saturday. Pt. States no blood in toilet but noticed small amount of light red blood while wiping. Pt. States stools have been soft, denies pain. States has ongoing MSK L sided back pain x1 month, believes it is spasm from working.

## 2016-04-01 NOTE — ED Provider Notes (Signed)
[facility] DEPT Provider Note   CSN: [id number] Arrival date & time: 04/01/16  0715  History   Chief Complaint Chief Complaint  Patient presents with  . Blood In Stools    HPI Kara Saunders is a 65 y.o. female.   Rectal Bleeding  Quality:  Bright red Amount:  Scant Duration:  3 days Timing:  Sporadic Chronicity:  New Context: hemorrhoids   Context: not constipation and not rectal pain   Similar prior episodes: yes   Associated symptoms: no abdominal pain, no dizziness, no epistaxis, no fever, no hematemesis, no light-headedness, no loss of consciousness and no vomiting   Risk factors: no anticoagulant use    Past Medical History:  Diagnosis Date  . Arthritis   . Cardiomegaly   . Deaf   . High cholesterol   . Hypertension    There are no active problems to display for this patient.  Past Surgical History:  Procedure Laterality Date  . ABDOMINAL HYSTERECTOMY    . FOOT SURGERY     bilateral  . INGUINAL HERNIA REPAIR    . TUMOR EXCISION     finger   OB History    No data available     Home Medications    Prior to Admission medications   Medication Sig Start Date End Date Taking? Authorizing Provider  cholecalciferol (VITAMIN D) 1000 UNITS tablet Take 1,000 Units by mouth daily.    Historical Provider, MD  ibuprofen (ADVIL,MOTRIN) 600 MG tablet Take 1 tablet (600 mg total) by mouth every 8 (eight) hours as needed for pain. Patient not taking: Reported on 05/03/2014 08/21/12   Clayton Bibles, PA-C  lisinopril (PRINIVIL,ZESTRIL) 10 MG tablet Take 1 tablet (10 mg total) by mouth daily. 08/21/12   Clayton Bibles, PA-C  meloxicam (MOBIC) 15 MG tablet Take 1 tablet (15 mg total) by mouth daily. Take 1 daily with food. Patient not taking: Reported on 05/03/2014 02/16/14   Margarita Mail, PA-C  naproxen (NAPROSYN) 375 MG tablet Take 375 mg by mouth 2 (two) times daily as needed (for pain).    Historical Provider, MD  oxyCODONE-acetaminophen (ROXICET) 5-325 MG per tablet  Take 1 tablet by mouth every 6 (six) hours as needed for severe pain. 05/03/14   Charlesetta Shanks, MD  simvastatin (ZOCOR) 20 MG tablet Take 20 mg by mouth every evening.     Historical Provider, MD  triamterene-hydrochlorothiazide (DYAZIDE) 37.5-25 MG per capsule Take 1 capsule by mouth daily.    Historical Provider, MD   Family History Family History  Problem Relation Age of Onset  . Stroke Father   . Prostate cancer Father   . Hypertension Paternal Grandmother   . Stroke Paternal Uncle   . Stomach cancer Paternal Uncle    Social History Social History  Substance Use Topics  . Smoking status: Never Smoker  . Smokeless tobacco: Never Used  . Alcohol use No   Allergies   Review of patient's allergies indicates no known allergies.   Review of Systems Review of Systems  Constitutional: Negative for fever.  HENT: Negative for nosebleeds.   Gastrointestinal: Positive for hematochezia. Negative for abdominal pain, hematemesis and vomiting.  Neurological: Negative for dizziness, loss of consciousness and light-headedness.  All other systems reviewed and are negative.  Physical Exam Updated Vital Signs BP 168/69 (BP Location: Right Arm)   Pulse 63   Temp 97.7 F (36.5 C) (Oral)   Resp 22   Ht 5\' 3"  (1.6 m)   Wt 63.5 kg  SpO2 100%   BMI 24.80 kg/m   Physical Exam  Constitutional: She is oriented to person, place, and time. She appears well-developed and well-nourished. No distress.  Eyes: EOM are normal.  Neck: Normal range of motion.  Cardiovascular: Normal rate.   Pulmonary/Chest: Effort normal.  Abdominal: Soft. She exhibits no distension and no mass. There is no tenderness. There is no rebound and no guarding.  Genitourinary:  Genitourinary Comments: External hemorrhoids noted on exam. No frank blood with rectal exam  Musculoskeletal: She exhibits no deformity.  Neurological: She is alert and oriented to person, place, and time. No cranial nerve deficit.  Skin:  Skin is warm. Capillary refill takes less than 2 seconds. She is not diaphoretic.  Psychiatric: Her behavior is normal.  Nursing note and vitals reviewed.  ED Treatments / Results  Labs (all labs ordered are listed, but only abnormal results are displayed) Labs Reviewed  CBC  COMPREHENSIVE METABOLIC PANEL   EKG  EKG Interpretation None      Radiology No results found.  Procedures Procedures (including critical care time)  Medications Ordered in ED Medications - No data to display   Initial Impression / Assessment and Plan / ED Course  I have reviewed the triage vital signs and the nursing notes.  Pertinent labs & imaging results that were available during my care of the patient were reviewed by me and considered in my medical decision making (see chart for details).  Clinical Course   Patient is a pleasant 65 y.o female who presents to the ED with small trace blood in her stool when she wipes after a painless bowel movement. Patient thinks she has had hemorrhoids in the past but can't remember  She does have a documented colonoscopy that was significant for polyps in 2012 and recommended follow up in 5 years.   Patient denies any abdominal pain, fever, vomiting, vaginal bleeding or discharge.    Denies anticoagulants.   Patient denies any systemic symptoms of SOB, lightheadedness or chest pain.    Physical exam remarkable for non thrombosed external hemorrhoids with no frank blood on exam.   Given lack of symptoms and trace blood only when wiping, believe this to be due to hemorrhoids.   CBC unremarkable.  Patient instructed to follow up with PCP and repeat colonoscopy as outpatient.  She was in agreement and discharged home in good health.   Final Clinical Impressions(s) / ED Diagnoses   Final diagnoses:  Rectal bleeding  Hemorrhoids, unspecified hemorrhoid type   New Prescriptions New Prescriptions   No medications on file     Roberto Scales,  MD 04/01/16 1805    Pattricia Boss, MD 04/02/16 1025

## 2016-04-01 NOTE — ED Notes (Signed)
ASL interpreter at bedside.

## 2016-04-01 NOTE — Discharge Planning (Signed)
EDCM reviewed discharging chart for possible CM needs.  No needs identified.    

## 2016-06-12 ENCOUNTER — Other Ambulatory Visit: Payer: Self-pay | Admitting: Primary Care

## 2016-06-12 DIAGNOSIS — Z1231 Encounter for screening mammogram for malignant neoplasm of breast: Secondary | ICD-10-CM

## 2017-05-13 ENCOUNTER — Ambulatory Visit: Payer: Self-pay

## 2018-02-26 ENCOUNTER — Ambulatory Visit
Admission: RE | Admit: 2018-02-26 | Discharge: 2018-02-26 | Disposition: A | Discharge status: Home / Self Care | Payer: Medicare Other | Source: Ambulatory Visit | Attending: Primary Care | Admitting: Primary Care

## 2018-02-26 DIAGNOSIS — Z1231 Encounter for screening mammogram for malignant neoplasm of breast: Secondary | ICD-10-CM

## 2019-01-17 ENCOUNTER — Other Ambulatory Visit: Payer: Self-pay | Admitting: Family Medicine

## 2019-01-17 DIAGNOSIS — R109 Unspecified abdominal pain: Secondary | ICD-10-CM

## 2019-01-26 ENCOUNTER — Ambulatory Visit
Admission: RE | Admit: 2019-01-26 | Discharge: 2019-01-26 | Disposition: A | Discharge status: Home / Self Care | Payer: Medicare Other | Source: Ambulatory Visit | Attending: Family Medicine | Admitting: Family Medicine

## 2019-01-26 ENCOUNTER — Other Ambulatory Visit: Payer: Self-pay

## 2019-01-26 DIAGNOSIS — R109 Unspecified abdominal pain: Secondary | ICD-10-CM

## 2019-01-26 MED ORDER — IOPAMIDOL (ISOVUE-300) INJECTION 61%
100.0000 mL | Freq: Once | INTRAVENOUS | Status: AC | PRN
  Administered 2019-01-26: 100 mL via INTRAVENOUS

## 2019-02-18 ENCOUNTER — Other Ambulatory Visit: Payer: Self-pay | Admitting: Family Medicine

## 2019-02-21 ENCOUNTER — Other Ambulatory Visit: Payer: Self-pay | Admitting: Family Medicine

## 2019-02-21 DIAGNOSIS — R911 Solitary pulmonary nodule: Secondary | ICD-10-CM

## 2019-04-05 ENCOUNTER — Ambulatory Visit
Admission: RE | Admit: 2019-04-05 | Discharge: 2019-04-05 | Disposition: A | Discharge status: Home / Self Care | Payer: Medicare Other | Source: Ambulatory Visit | Attending: Family Medicine | Admitting: Family Medicine

## 2019-04-05 DIAGNOSIS — R911 Solitary pulmonary nodule: Secondary | ICD-10-CM

## 2019-04-05 MED ORDER — IOPAMIDOL (ISOVUE-300) INJECTION 61%
75.0000 mL | Freq: Once | INTRAVENOUS | Status: AC | PRN
  Administered 2019-04-05: 12:00:00 75 mL via INTRAVENOUS

## 2019-04-06 ENCOUNTER — Other Ambulatory Visit: Payer: Self-pay | Admitting: Family Medicine

## 2019-04-06 DIAGNOSIS — Z1231 Encounter for screening mammogram for malignant neoplasm of breast: Secondary | ICD-10-CM

## 2019-04-20 ENCOUNTER — Encounter: Payer: Self-pay | Admitting: Pulmonary Disease

## 2019-04-26 ENCOUNTER — Other Ambulatory Visit: Payer: Self-pay

## 2019-04-26 ENCOUNTER — Encounter: Payer: Self-pay | Admitting: Pulmonary Disease

## 2019-04-26 ENCOUNTER — Ambulatory Visit (INDEPENDENT_AMBULATORY_CARE_PROVIDER_SITE_OTHER): Payer: Medicare Other | Admitting: Pulmonary Disease

## 2019-04-26 VITALS — BP 142/82 | HR 92 | Temp 98.5°F | Ht 62.0 in | Wt 137.2 lb

## 2019-04-26 DIAGNOSIS — I10 Essential (primary) hypertension: Secondary | ICD-10-CM | POA: Insufficient documentation

## 2019-04-26 DIAGNOSIS — H9190 Unspecified hearing loss, unspecified ear: Secondary | ICD-10-CM | POA: Insufficient documentation

## 2019-04-26 DIAGNOSIS — H9193 Unspecified hearing loss, bilateral: Secondary | ICD-10-CM

## 2019-04-26 DIAGNOSIS — R918 Other nonspecific abnormal finding of lung field: Secondary | ICD-10-CM | POA: Diagnosis not present

## 2019-04-26 NOTE — Progress Notes (Signed)
Subjective:   PATIENT ID: Kara Saunders GENDER: female DOB: [birth year], MRN: [id number]   HPI  Chief Complaint  Patient presents with  . Consult    abnormal CT images   Reason for Visit: New consult for pulmonary nodules  [name] is a 68 year old female non-nicotine smoking history and HTN who presents for abnormal CT. Sign language interpreter present to communicate history.  She was referred to Pulmonary clinic for incidental pulmonary nodules found on CT abdomen. Dedicated CT Chest was obtained and identified additional subcentimeter bilateral nodules. She denies knowing about any lung nodules in the past. She does not smoke nicotine products but she does smoke marijuana for chronic back and muscle pain. She smokes 2-3 days a week, 1/3-1/2 blunt each time. She has been smoking since she was 68 years old. Denies inhalation of any other products. Denies shortness of breath, wheezing and cough. She ambulates within her home without assistance. Denies any risky occupational or environmental exposures. Denies unexplained fevers, chills, chest pain pain, cough, hemoptysis, unintentional weight loss. No family hx of lung cancer.  Social History: Never nicotine smoker >50 years smoking marijuana  I have personally reviewed patient's past medical/family/social history, allergies, current medications.  Past Medical History:  Diagnosis Date  . Arthritis   . Cardiomegaly   . Deaf   . High cholesterol   . Hypertension      Family History  Problem Relation Age of Onset  . Stroke Father   . Prostate cancer Father   . Hypertension Paternal Grandmother   . Stroke Paternal Uncle   . Stomach cancer Paternal Uncle   . Breast cancer Cousin      Social History   Occupational History  . Occupation: disabled  Tobacco Use  . Smoking status: Never Smoker  . Smokeless tobacco: Never Used  Substance and Sexual Activity  . Alcohol use: No  . Drug use: Yes    Types:  Marijuana    Comment: rarely  . Sexual activity: Yes    No Known Allergies   Outpatient Medications Prior to Visit  Medication Sig Dispense Refill  . cholecalciferol (VITAMIN D) 1000 UNITS tablet Take 1,000 Units by mouth daily.    Marland Kitchen ibuprofen (ADVIL,MOTRIN) 600 MG tablet Take 1 tablet (600 mg total) by mouth every 8 (eight) hours as needed for pain. (Patient not taking: Reported on 05/03/2014) 15 tablet 0  . lisinopril (PRINIVIL,ZESTRIL) 10 MG tablet Take 1 tablet (10 mg total) by mouth daily. 30 tablet 0  . meloxicam (MOBIC) 15 MG tablet Take 1 tablet (15 mg total) by mouth daily. Take 1 daily with food. (Patient not taking: Reported on 05/03/2014) 10 tablet 0  . naproxen (NAPROSYN) 375 MG tablet Take 375 mg by mouth 2 (two) times daily as needed (for pain).    Marland Kitchen oxyCODONE-acetaminophen (ROXICET) 5-325 MG per tablet Take 1 tablet by mouth every 6 (six) hours as needed for severe pain. 15 tablet 0  . simvastatin (ZOCOR) 20 MG tablet Take 20 mg by mouth every evening.     . triamterene-hydrochlorothiazide (DYAZIDE) 37.5-25 MG per capsule Take 1 capsule by mouth daily.     Facility-Administered Medications Prior to Visit  Medication Dose Route Frequency Provider Last Rate Last Dose  . 0.9 %  sodium chloride infusion  500 mL Intravenous Continuous Lafayette Dragon, MD        Review of Systems  Constitutional: Negative for chills, diaphoresis, fever, malaise/fatigue and weight loss.  HENT:  Positive for congestion. Negative for ear pain and sore throat.   Respiratory: Positive for cough. Negative for hemoptysis, sputum production, shortness of breath and wheezing.   Cardiovascular: Negative for chest pain, palpitations and leg swelling.  Gastrointestinal: Positive for abdominal pain. Negative for heartburn and nausea.       Patient has hernia surgery   Genitourinary: Negative for frequency.  Musculoskeletal: Positive for myalgias. Negative for joint pain.  Skin: Negative for itching and  rash.  Neurological: Positive for dizziness and weakness. Negative for headaches.       Right knee gives out  Endo/Heme/Allergies: Does not bruise/bleed easily.  Psychiatric/Behavioral: Positive for depression. The patient is not nervous/anxious.      Objective:   Vitals:   04/26/19 1123  BP: (!) 142/82  Pulse: 92  Temp: 98.5 F (36.9 C)  TempSrc: Temporal  SpO2: 97%  Weight: 137 lb 3.2 oz (62.2 kg)  Height: 5\' 2"  (1.575 m)     Physical Exam: General: Well-appearing, no acute distress HENT: [place], AT Eyes: EOMI, no scleral icterus Respiratory: Clear to auscultation bilaterally.  No crackles, wheezing or rales Cardiovascular: RRR, -M/R/G, no JVD GI: BS+, soft, nontender Extremities:-Edema,-tenderness Neuro: AAO x4, CNII-XII grossly intact Skin: Intact, no rashes or bruising Psych: Normal mood, normal affect  Data Reviewed:  Imaging: CT Chest 01/26/19 - Small scattered bilateral lung nodules presents with largest ~5.49mm in RML. RLL scarring  PFT: None on file  Labs: CBC    Component Value Date/Time   WBC 6.7 04/01/2016 0751   RBC 4.62 04/01/2016 0751   HGB 13.9 04/01/2016 0751   HCT 41.9 04/01/2016 0751   PLT 312 04/01/2016 0751   MCV 90.7 04/01/2016 0751   MCH 30.1 04/01/2016 0751   MCHC 33.2 04/01/2016 0751   RDW 13.0 04/01/2016 0751   LYMPHSABS 1.7 04/20/2011 0837   MONOABS 0.5 04/20/2011 0837   EOSABS 0.2 04/20/2011 0837   BASOSABS 0.0 04/20/2011 0837   BMET    Component Value Date/Time   NA 138 04/01/2016 0751   K 4.2 04/01/2016 0751   CL 102 04/01/2016 0751   CO2 26 04/01/2016 0751   GLUCOSE 102 (H) 04/01/2016 0751   BUN 10 04/01/2016 0751   CREATININE 0.84 04/01/2016 0751   CALCIUM 9.6 04/01/2016 0751   GFRNONAA >60 04/01/2016 0751   GFRAA >60 04/01/2016 0751   Imaging, labs and tests noted above have been reviewed independently by me.    Assessment & Plan:   Discussion: 68 year old female non-nicotine smoking history and HTN who  presents multiple subcentimeter pulmonary nodules with largest nodule ~5.35mm. Reviewed imaging with patient via interpreter and addressed questions. Likely benign however with extensive marijuana smoking history will continue to closely monitor pulmonary lesions. Otherwise patient is asymptomatic from a pulmonary standpoint  Multiple pulmonary nodules --We will schedule a repeat CT Chest in 6 months to monitor pulmonary nodules --Follow-up with me after your scan  Health Maintenance  There is no immunization history on file for this patient.   Orders Placed This Encounter  Procedures  . CT Chest Wo Contrast    Please schedule follow-up in 6 Months with JE    Standing Status:   Future    Standing Expiration Date:   06/25/2020    Order Specific Question:   Preferred imaging location?    Answer:   [facility]    Order Specific Question:   Radiology Contrast Protocol - do NOT remove file path    Answer:   \\  [email or web address]  No orders of the defined types were placed in this encounter.   Return in about 3 months (around 07/27/2019).  Kell[place], MD [facility] 04/26/2019 8:41 AM  Office Number [phone]

## 2019-04-26 NOTE — Patient Instructions (Signed)
Multiple pulmonary nodules --We will schedule a repeat CT Chest in 6 months to monitor pulmonary nodules --Follow-up with me after your scan   Schedule for 30 minute visit

## 2019-04-28 ENCOUNTER — Telehealth: Payer: Self-pay | Admitting: Pulmonary Disease

## 2019-04-28 NOTE — Telephone Encounter (Signed)
No I did not call the patient.

## 2019-04-28 NOTE — Telephone Encounter (Signed)
Attempted to contact patient via sign language interpreter, phone just rings and rings and voicemail never kicked in.   Will leave in triage and try back later.

## 2019-04-28 NOTE — Telephone Encounter (Signed)
Called patient via interpreter. She stated that someone from our office called her this morning around 1015am and they did not leave a name. She wondered if Dr. Loanne Drilling had called her. I advised her that I would check with Dr. Loanne Drilling to check.   Dr. Loanne Drilling, please advise if you called her. Thanks!

## 2019-04-28 NOTE — Telephone Encounter (Signed)
Pt returned call and would like a call back.   °

## 2019-05-30 ENCOUNTER — Ambulatory Visit
Admission: RE | Admit: 2019-05-30 | Discharge: 2019-05-30 | Disposition: A | Discharge status: Home / Self Care | Payer: Medicare Other | Source: Ambulatory Visit | Attending: Family Medicine | Admitting: Family Medicine

## 2019-05-30 ENCOUNTER — Other Ambulatory Visit: Payer: Self-pay

## 2019-05-30 DIAGNOSIS — Z1231 Encounter for screening mammogram for malignant neoplasm of breast: Secondary | ICD-10-CM

## 2019-07-09 ENCOUNTER — Emergency Department (HOSPITAL_COMMUNITY): Payer: Medicare Other

## 2019-07-09 ENCOUNTER — Other Ambulatory Visit: Payer: Self-pay

## 2019-07-09 ENCOUNTER — Emergency Department (HOSPITAL_COMMUNITY)
Admission: EM | Admit: 2019-07-09 | Discharge: 2019-07-09 | Disposition: A | Discharge status: Home / Self Care | Payer: Medicare Other | Attending: Emergency Medicine | Admitting: Emergency Medicine

## 2019-07-09 ENCOUNTER — Encounter (HOSPITAL_COMMUNITY): Payer: Self-pay | Admitting: Emergency Medicine

## 2019-07-09 DIAGNOSIS — Z79899 Other long term (current) drug therapy: Secondary | ICD-10-CM | POA: Insufficient documentation

## 2019-07-09 DIAGNOSIS — F1210 Cannabis abuse, uncomplicated: Secondary | ICD-10-CM | POA: Insufficient documentation

## 2019-07-09 DIAGNOSIS — M79604 Pain in right leg: Secondary | ICD-10-CM | POA: Insufficient documentation

## 2019-07-09 DIAGNOSIS — G8929 Other chronic pain: Secondary | ICD-10-CM

## 2019-07-09 DIAGNOSIS — I10 Essential (primary) hypertension: Secondary | ICD-10-CM | POA: Insufficient documentation

## 2019-07-09 DIAGNOSIS — M1611 Unilateral primary osteoarthritis, right hip: Secondary | ICD-10-CM | POA: Diagnosis not present

## 2019-07-09 MED ORDER — MELOXICAM 15 MG PO TABS: 15.0000 mg | ORAL_TABLET | Freq: Every day | ORAL | 0 refills | Status: AC

## 2019-07-09 NOTE — ED Provider Notes (Signed)
[facility] EMERGENCY DEPARTMENT Provider Note   CSN: [id number] Arrival date & time: 07/09/19  [id number]     History Chief Complaint  Patient presents with  . Leg Pain    Kara Saunders is a 69 y.o. female who presents with R leg pain. She states has chronic R leg pain for the last 6 years. It is intermittent and flares up sometimes. The pain is over the anterior thigh and sometimes radiates from the hip. She denies fever, chills, back pain, leg swelling or redness. She reports being very active and it hurts more with movement. It also hurts more when its cold and rainy. She was told it was arthritis pain but is unsure of her exact diagnosis. She takes ASA or Aleve for the pain but it doesn't help. She has told her doctor about this pain and also reports going to an orthopedic doctor but can't recall which one. She denies any new trauma to the area.  HPI     Past Medical History:  Diagnosis Date  . Arthritis   . Cardiomegaly   . Deaf   . High cholesterol   . Hypertension     Patient Active Problem List   Diagnosis Date Noted  . Multiple pulmonary nodules 04/26/2019  . Deaf 04/26/2019  . Hypertension 04/26/2019    Past Surgical History:  Procedure Laterality Date  . ABDOMINAL HYSTERECTOMY    . FOOT SURGERY     bilateral  . INGUINAL HERNIA REPAIR    . TUMOR EXCISION     finger     OB History   No obstetric history on file.     Family History  Problem Relation Age of Onset  . Stroke Father   . Prostate cancer Father   . Hypertension Paternal Grandmother   . Stroke Paternal Uncle   . Stomach cancer Paternal Uncle   . Breast cancer Cousin     Social History   Tobacco Use  . Smoking status: Never Smoker  . Smokeless tobacco: Never Used  Substance Use Topics  . Alcohol use: No  . Drug use: Yes    Types: Marijuana    Comment: rarely    Home Medications Prior to Admission medications   Medication Sig Start Date End Date Taking?  Authorizing Provider  cholecalciferol (VITAMIN D) 1000 UNITS tablet Take 1,000 Units by mouth daily.    [provider]  lisinopril (PRINIVIL,ZESTRIL) 10 MG tablet Take 1 tablet (10 mg total) by mouth daily. 08/21/12   Clayton Bibles, PA-C  naproxen (NAPROSYN) 375 MG tablet Take 375 mg by mouth 2 (two) times daily as needed (for pain).    [provider]  oxyCODONE-acetaminophen (ROXICET) 5-325 MG per tablet Take 1 tablet by mouth every 6 (six) hours as needed for severe pain. 05/03/14   Moding, Langley Gauss, MD  simvastatin (ZOCOR) 20 MG tablet Take 20 mg by mouth every evening.     [provider]  triamterene-hydrochlorothiazide (DYAZIDE) 37.5-25 MG per capsule Take 1 capsule by mouth daily.    [provider]    Allergies    Patient has no known allergies.  Review of Systems   Review of Systems  Musculoskeletal: Positive for arthralgias and myalgias.  Skin: Negative for wound.  Neurological: Negative for weakness and numbness.    Physical Exam Updated Vital Signs BP (!) 148/64 (BP Location: Right Arm)   Pulse 78   Temp 98 F (36.7 C) (Oral)   Resp 16  SpO2 98%   Physical Exam Vitals and nursing note reviewed.  Constitutional:      General: She is not in acute distress.    Appearance: Normal appearance. She is well-developed. She is not ill-appearing.  HENT:     Head: Normocephalic and atraumatic.  Eyes:     General: No scleral icterus.       Right eye: No discharge.        Left eye: No discharge.     Conjunctiva/sclera: Conjunctivae normal.     Pupils: Pupils are equal, round, and reactive to light.  Cardiovascular:     Rate and Rhythm: Normal rate.  Pulmonary:     Effort: Pulmonary effort is normal. No respiratory distress.  Abdominal:     General: There is no distension.  Musculoskeletal:     Cervical back: Normal range of motion.     Comments: Right hip and thigh: Skin is warm and perfused. No swelling or redness noted. No  significant tenderness. No knee, calf, ankle or foot tenderness. 2+ DP pulse. Ambulatory  Skin:    General: Skin is warm and dry.  Neurological:     Mental Status: She is alert and oriented to person, place, and time.  Psychiatric:        Behavior: Behavior normal.     ED Results / Procedures / Treatments   Labs (all labs ordered are listed, but only abnormal results are displayed) Labs Reviewed - No data to display  EKG None  Radiology DG Femur Min 2 Views Right  Result Date: 07/09/2019 CLINICAL DATA:  Right upper leg pain.  No injury. EXAM: RIGHT FEMUR 2 VIEWS COMPARISON:  None. FINDINGS: Mild to moderate osteoarthritis of the right hip. No evidence of acute fracture or dislocation. 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 No acute findings. Electronically Signed   By: Marin Olp M.D.   On: 07/09/2019 10:20    Procedures Procedures (including critical care time)  Medications Ordered in ED Medications - No data to display  ED Course  I have reviewed the triage vital signs and the nursing notes.  Pertinent labs & imaging results that were available during my care of the patient were reviewed by me and considered in my medical decision making (see chart for details).  69 year old female with chronic right thigh pain which has been worsening recently.  She states that she has been very active in its been cold lately which I suspect is exacerbating her pain.  I do not see where she has been evaluated for this recently and I do not see any notes from orthopedics.  Will order plain film of the thigh where she is hurting but explained to her that she will likely need to follow-up with her doctor and possibly orthopedics again for management of this pain.  Discussed case with Dr. Vallery Ridge.  X-ray shows mild to moderate arthritis of the right hip which could be causing her pain.  Some other considerations are soft tissue pathology.  Consider MRI as an outpatient if  she continues to have refractory symptoms.  Will prescribe meloxicam and have her follow-up with her doctor.  MDM Rules/Calculators/A&P                      Final Clinical Impression(s) / ED Diagnoses Final diagnoses:  Chronic pain of right lower extremity  Arthritis of right hip    Rx / DC Orders ED Discharge Orders    None  Recardo Evangelist, PA-C 07/09/19 1149    Charlesetta Shanks, MD 07/10/19 0930

## 2019-07-09 NOTE — ED Notes (Signed)
Pt transported to xray 

## 2019-07-09 NOTE — Discharge Instructions (Signed)
Please start Meloxicam daily with food. Do not take with other NSAIDs such as Ibuprofen or Aleve. You can take Tylenol with this medicine Use a heating pad or sit in a warm bath if pain is severe Please follow up with your doctor and discuss referral for possible ortho evaluation or physical therapy Return if worsening

## 2019-07-09 NOTE — ED Notes (Signed)
PA at bedside.

## 2019-07-09 NOTE — ED Notes (Signed)
Pt d/c home per MD order. Discharge summary reviewed with pt using the wallee interpretor machine for sign language. Expresses understanding. Ambulatory off unit.

## 2019-07-09 NOTE — ED Triage Notes (Signed)
C/o chronic R upper leg pain that she believes is arthritis. Denies injury.

## 2019-08-02 ENCOUNTER — Encounter: Payer: Self-pay | Admitting: Gastroenterology

## 2019-08-12 ENCOUNTER — Encounter: Payer: Self-pay | Admitting: Gastroenterology

## 2019-08-30 ENCOUNTER — Ambulatory Visit: Payer: Medicare Other | Admitting: Gastroenterology

## 2019-09-23 ENCOUNTER — Other Ambulatory Visit: Payer: Self-pay | Admitting: Family Medicine

## 2019-09-23 DIAGNOSIS — M79651 Pain in right thigh: Secondary | ICD-10-CM

## 2019-09-26 ENCOUNTER — Other Ambulatory Visit: Payer: Self-pay

## 2019-09-26 ENCOUNTER — Ambulatory Visit
Admission: RE | Admit: 2019-09-26 | Discharge: 2019-09-26 | Disposition: A | Discharge status: Home / Self Care | Payer: Medicare HMO | Source: Ambulatory Visit | Attending: Family Medicine | Admitting: Family Medicine

## 2019-09-26 DIAGNOSIS — M79651 Pain in right thigh: Secondary | ICD-10-CM

## 2019-10-12 ENCOUNTER — Other Ambulatory Visit: Payer: Medicare Other

## 2019-11-01 ENCOUNTER — Ambulatory Visit
Admission: RE | Admit: 2019-11-01 | Discharge: 2019-11-01 | Disposition: A | Discharge status: Home / Self Care | Payer: Medicare HMO | Source: Ambulatory Visit | Attending: Pulmonary Disease | Admitting: Pulmonary Disease

## 2019-11-01 ENCOUNTER — Other Ambulatory Visit: Payer: Self-pay

## 2019-11-01 ENCOUNTER — Other Ambulatory Visit: Payer: Medicare HMO

## 2019-11-01 ENCOUNTER — Inpatient Hospital Stay: Admission: RE | Admit: 2019-11-01 | Payer: Medicare HMO | Source: Ambulatory Visit

## 2019-11-01 DIAGNOSIS — R918 Other nonspecific abnormal finding of lung field: Secondary | ICD-10-CM

## 2019-11-02 ENCOUNTER — Ambulatory Visit (INDEPENDENT_AMBULATORY_CARE_PROVIDER_SITE_OTHER): Payer: Medicare HMO | Admitting: Gastroenterology

## 2019-11-02 ENCOUNTER — Encounter: Payer: Self-pay | Admitting: Gastroenterology

## 2019-11-02 VITALS — BP 120/60 | HR 63 | Ht 63.0 in | Wt 131.0 lb

## 2019-11-02 DIAGNOSIS — R634 Abnormal weight loss: Secondary | ICD-10-CM | POA: Diagnosis not present

## 2019-11-02 DIAGNOSIS — K921 Melena: Secondary | ICD-10-CM

## 2019-11-02 DIAGNOSIS — R109 Unspecified abdominal pain: Secondary | ICD-10-CM

## 2019-11-02 DIAGNOSIS — R6881 Early satiety: Secondary | ICD-10-CM | POA: Diagnosis not present

## 2019-11-02 MED ORDER — DICYCLOMINE HCL 20 MG PO TABS: 20.0000 mg | ORAL_TABLET | Freq: Four times a day (QID) | ORAL | 1 refills | Status: AC | PRN

## 2019-11-02 MED ORDER — PLENVU 140 G PO SOLR: 1.0000 | ORAL | 0 refills | Status: DC

## 2019-11-02 NOTE — Patient Instructions (Signed)
If you are age 69 or older, your body mass index should be between 23-30. Your Body mass index is 23.21 kg/m. If this is out of the aforementioned range listed, please consider follow up with your Primary Care Provider.  If you are age 50 or younger, your body mass index should be between 19-25. Your Body mass index is 23.21 kg/m. If this is out of the aformentioned range listed, please consider follow up with your Primary Care Provider.   We have sent the following medications to your pharmacy for you to pick up at your convenience: Dicyclomine, Plenvu  You have been scheduled for an endoscopy and colonoscopy. Please follow the written instructions given to you at your visit today. Please pick up your prep supplies at the pharmacy within the next 1-3 days. If you use inhalers (even only as needed), please bring them with you on the day of your procedure.  Due to recent changes in healthcare laws, you may see the results of your imaging and laboratory studies on MyChart before your provider has had a chance to review them.  We understand that in some cases there may be results that are confusing or concerning to you. Not all laboratory results come back in the same time frame and the provider may be waiting for multiple results in order to interpret others.  Please give [place] 48 hours in order for your provider to thoroughly review all the results before contacting the office for clarification of your results.   Thank you for choosing me and [facility].  Dr. Tarri Glenn

## 2019-11-02 NOTE — Progress Notes (Signed)
Referring Provider: Dorna Mai, MD Primary Care Physician:  Dorna Mai, MD  Reason for Consultation:  Early satiety   IMPRESSION:  Early satiety with unintentional weight loss of 30-40 pounds over the last year Intermittent right sided abdominal pain, intermittent x years    - CT abd/pelvis with contrast 0000000: small umbilical hernia, small left inguinal hernia Alternating diarrhea and constipation BRB per rectum Regular marijuana use History of hyperplastic polyps on colonoscopy 2012 Pulmonary nodules, followed by Dr. Loanne Drilling No known family history of colon cancer or polyps  Unexplained weight loss in the setting of longstanding history of intermittent right sided abdominal pain and alternating bowel habits with occasional blood in the stool: CT scan last year did not provide a source for symptoms, but the CT scan preceded her weight loss. Likely has underlying IBS, but, this would not explain the weight loss or blood in the stool. EGD with esophageal and gastric biopsies and colonoscopy recommended. Awaiting follow-up chest CT results (study performed yesterday) to further evaluate her pulmonary nodules.    PLAN: Dicylclomine 20 mg QID prn abdominal pain EGD and colonoscopy  Please see the "Patient Instructions" section for addition details about the plan.  I spent 45 minutes, including in depth chart review, face-to-face time with the patient, coordinating care, ordering studies and medications as appropriate, and documentation.   HPI: Kara Saunders is a 69 y.o. female referred by Dr. Francene Castle for early satiety. The history is obtained through the patient with the assistance of a sign language interpreter and review of her electronic health record.  She has hypertension, hypercholesterolemia, bilateral deafness, pulmonary nodules under evaluation by Dr. Ebony Hail.  She regularly uses marijuana. Prior cholecystectomy. She has completed the Covid vaccine.   Previously  seen by Dr. Olevia Perches for periumbilical cramping, abdominal pain, and constipation in 2012.  She notes those symptoms seem to come and go over the years. Colonoscopy in 2012 revealed 8 diminutive hyperplastic polyps. Repeat colonoscopy recommended in 10 years.    Is here today because of a one year history of early satiety, intermittent non-radiating right-sided to mid-abdominal pain/cramping, weight loss of 30-40 pounds, similar to symptoms in 2012. Pain often improved with defecation.  No appetite. Diarrhea alternates with constipation. Intermittent red blood on the toilet paper. No mucous. No other identified exacerbating or relieving features. No consistent change with eating or position.   Follows a high fiber diet, drinks water and walks to keep her bowel moving. Ocasionally uses stool softeners but she can't remember the name.   She is worried that she might have cancer.   CT of the abdomen and pelvis with contrast 01/26/2019 to evaluate months of abdominal pain showed no acute findings.  There is a small fat-containing umbilical hernia.  A small fat-containing left inguinal hernia.  Multiple small pulmonary nodules.  Most recent labs available to me are from 2017 and show a normal CBC and a normal CMP.  Family history is largely unknown. No known family history of colon cancer or polyps. No family history of uterine/endometrial cancer, pancreatic cancer or gastric/stomach cancer.   Past Medical History:  Diagnosis Date  . Arthritis   . Cardiomegaly   . Deaf   . High cholesterol   . Hypertension     Past Surgical History:  Procedure Laterality Date  . ABDOMINAL HYSTERECTOMY    . FOOT SURGERY     bilateral  . INGUINAL HERNIA REPAIR    . TUMOR EXCISION     finger  Current Outpatient Medications  Medication Sig Dispense Refill  . cholecalciferol (VITAMIN D) 1000 UNITS tablet Take 1,000 Units by mouth daily.    Marland Kitchen lisinopril (PRINIVIL,ZESTRIL) 10 MG tablet Take 1 tablet (10 mg  total) by mouth daily. 30 tablet 0  . meloxicam (MOBIC) 15 MG tablet Take 1 tablet (15 mg total) by mouth daily. 30 tablet 0  . naproxen (NAPROSYN) 375 MG tablet Take 375 mg by mouth 2 (two) times daily as needed (for pain).    . simvastatin (ZOCOR) 20 MG tablet Take 20 mg by mouth every evening.     . triamterene-hydrochlorothiazide (DYAZIDE) 37.5-25 MG per capsule Take 1 capsule by mouth daily.     Current Facility-Administered Medications  Medication Dose Route Frequency Provider Last Rate Last Admin  . 0.9 %  sodium chloride infusion  500 mL Intravenous Continuous Lafayette Dragon, MD        Allergies as of 11/02/2019  . (No Known Allergies)    Family History  Problem Relation Age of Onset  . Stroke Father   . Prostate cancer Father   . Hypertension Paternal Grandmother   . Stroke Paternal Uncle   . Stomach cancer Paternal Uncle   . Breast cancer Cousin     Social History   Socioeconomic History  . Marital status: Married    Spouse name: Not on file  . Number of children: 1  . Years of education: Not on file  . Highest education level: Not on file  Occupational History  . Occupation: disabled  Tobacco Use  . Smoking status: Never Smoker  . Smokeless tobacco: Never Used  Substance and Sexual Activity  . Alcohol use: No  . Drug use: Yes    Types: Marijuana    Comment: rarely  . Sexual activity: Yes  Other Topics Concern  . Not on file  Social History Narrative   Maybe 1 cup of coffee a day   Social Determinants of Health   Financial Resource Strain:   . Difficulty of Paying Living Expenses:   Food Insecurity:   . Worried About Charity fundraiser in the Last Year:   . Arboriculturist in the Last Year:   Transportation Needs:   . Film/video editor (Medical):   Marland Kitchen Lack of Transportation (Non-Medical):   Physical Activity:   . Days of Exercise per Week:   . Minutes of Exercise per Session:   Stress:   . Feeling of Stress :   Social Connections:   .  Frequency of Communication with Friends and Family:   . Frequency of Social Gatherings with Friends and Family:   . Attends Religious Services:   . Active Member of Clubs or Organizations:   . Attends Archivist Meetings:   Marland Kitchen Marital Status:   Intimate Partner Violence:   . Fear of Current or Ex-Partner:   . Emotionally Abused:   Marland Kitchen Physically Abused:   . Sexually Abused:     Review of Systems: 12 system ROS is negative except as noted above.   Physical Exam: General:   Alert,  well-nourished, pleasant and cooperative in NAD Head:  Normocephalic and atraumatic. Eyes:  Sclera clear, no icterus.   Conjunctiva pink. Ears:  Normal auditory acuity. Nose:  No deformity, discharge,  or lesions. Mouth:  No deformity or lesions.   Neck:  Supple; no masses or thyromegaly. Lungs:  Clear throughout to auscultation.   No wheezes. Heart:  Regular rate and rhythm; no murmurs.  Abdomen:  Soft,nontender, nondistended, normal bowel sounds, no rebound or guarding. No hepatosplenomegaly.   Rectal:  Deferred  Msk:  Symmetrical. No boney deformities LAD: No inguinal or umbilical LAD Extremities:  No clubbing or edema. Neurologic:  Alert and  oriented x4;  grossly nonfocal Skin:  Intact without significant lesions or rashes. Psych:  Alert and cooperative. Normal mood and affect.    Guenevere Roorda L. Tarri Glenn, MD, MPH 11/02/2019, 8:57 AM

## 2019-11-08 ENCOUNTER — Encounter: Payer: Self-pay | Admitting: Gastroenterology

## 2019-11-08 NOTE — Telephone Encounter (Signed)
OPENED IN ERROR

## 2019-12-14 ENCOUNTER — Encounter: Payer: Self-pay | Admitting: Gastroenterology

## 2019-12-14 ENCOUNTER — Other Ambulatory Visit: Payer: Self-pay

## 2019-12-14 ENCOUNTER — Ambulatory Visit (AMBULATORY_SURGERY_CENTER): Payer: Medicare HMO | Admitting: Gastroenterology

## 2019-12-14 VITALS — BP 180/60 | HR 60 | Temp 98.2°F | Resp 10 | Ht 63.0 in | Wt 131.0 lb

## 2019-12-14 DIAGNOSIS — K635 Polyp of colon: Secondary | ICD-10-CM | POA: Diagnosis not present

## 2019-12-14 DIAGNOSIS — K2100 Gastro-esophageal reflux disease with esophagitis, without bleeding: Secondary | ICD-10-CM | POA: Diagnosis not present

## 2019-12-14 DIAGNOSIS — K3189 Other diseases of stomach and duodenum: Secondary | ICD-10-CM

## 2019-12-14 DIAGNOSIS — K634 Enteroptosis: Secondary | ICD-10-CM

## 2019-12-14 DIAGNOSIS — K219 Gastro-esophageal reflux disease without esophagitis: Secondary | ICD-10-CM | POA: Diagnosis not present

## 2019-12-14 DIAGNOSIS — K209 Esophagitis, unspecified without bleeding: Secondary | ICD-10-CM

## 2019-12-14 DIAGNOSIS — R194 Change in bowel habit: Secondary | ICD-10-CM | POA: Diagnosis not present

## 2019-12-14 DIAGNOSIS — K2950 Unspecified chronic gastritis without bleeding: Secondary | ICD-10-CM | POA: Diagnosis not present

## 2019-12-14 DIAGNOSIS — D125 Benign neoplasm of sigmoid colon: Secondary | ICD-10-CM

## 2019-12-14 DIAGNOSIS — R6881 Early satiety: Secondary | ICD-10-CM

## 2019-12-14 DIAGNOSIS — K921 Melena: Secondary | ICD-10-CM

## 2019-12-14 MED ORDER — PANTOPRAZOLE SODIUM 40 MG PO TBEC: 40.0000 mg | DELAYED_RELEASE_TABLET | Freq: Every day | ORAL | 1 refills | Status: DC

## 2019-12-14 MED ORDER — SODIUM CHLORIDE 0.9 % IV SOLN: 500.0000 mL | INTRAVENOUS | Status: DC

## 2019-12-14 NOTE — Patient Instructions (Signed)
Continue present medications. Start Pantoprazole 40mg  every morning for 8 weeks to treat esophagitis.   Handouts provided on esophagitis, polyps and hemorrhoids.   YOU HAD AN ENDOSCOPIC PROCEDURE TODAY AT [facility]:   Refer to the procedure report that was given to you for any specific questions about what was found during the examination.  If the procedure report does not answer your questions, please call your gastroenterologist to clarify.  If you requested that your care partner not be given the details of your procedure findings, then the procedure report has been included in a sealed envelope for you to review at your convenience later.  YOU SHOULD EXPECT: Some feelings of bloating in the abdomen. Passage of more gas than usual.  Walking can help get rid of the air that was put into your GI tract during the procedure and reduce the bloating. If you had a lower endoscopy (such as a colonoscopy or flexible sigmoidoscopy) you may notice spotting of blood in your stool or on the toilet paper. If you underwent a bowel prep for your procedure, you may not have a normal bowel movement for a few days.  Please Note:  You might notice some irritation and congestion in your nose or some drainage.  This is from the oxygen used during your procedure.  There is no need for concern and it should clear up in a day or so.  SYMPTOMS TO REPORT IMMEDIATELY:   Following lower endoscopy (colonoscopy or flexible sigmoidoscopy):  Excessive amounts of blood in the stool  Significant tenderness or worsening of abdominal pains  Swelling of the abdomen that is new, acute  Fever of 100F or higher   Following upper endoscopy (EGD)  Vomiting of blood or coffee ground material  New chest pain or pain under the shoulder blades  Painful or persistently difficult swallowing  New shortness of breath  Fever of 100F or higher  Black, tarry-looking stools  For urgent or emergent issues, a  gastroenterologist can be reached at any hour by calling [phone]. Do not use MyChart messaging for urgent concerns.    DIET:  We do recommend a small meal at first, but then you may proceed to your regular diet.  Drink plenty of fluids but you should avoid alcoholic beverages for 24 hours.  ACTIVITY:  You should plan to take it easy for the rest of today and you should NOT DRIVE or use heavy machinery until tomorrow (because of the sedation medicines used during the test).    FOLLOW UP: Our staff will call the number listed on your records 48-72 hours following your procedure to check on you and address any questions or concerns that you may have regarding the information given to you following your procedure. If we do not reach you, we will leave a message.  We will attempt to reach you two times.  During this call, we will ask if you have developed any symptoms of COVID 19. If you develop any symptoms (ie: fever, flu-like symptoms, shortness of breath, cough etc.) before then, please call [phone].  If you test positive for Covid 19 in the 2 weeks post procedure, please call and report this information to [place].    If any biopsies were taken you will be contacted by phone or by letter within the next 1-3 weeks.  Please call us at [phone] if you have not heard about the biopsies in 3 weeks.    SIGNATURES/CONFIDENTIALITY: You and/or your care partner  have signed paperwork which will be entered into your electronic medical record.  These signatures attest to the fact that that the information above on your After Visit Summary has been reviewed and is understood.  Full responsibility of the confidentiality of this discharge information lies with you and/or your care-partner.

## 2019-12-14 NOTE — Progress Notes (Signed)
Called to room to assist during endoscopic procedure.  Patient ID and intended procedure confirmed with present staff. Received instructions for my participation in the procedure from the performing physician.  

## 2019-12-14 NOTE — Progress Notes (Signed)
Interpreter used today at the [facility] for this pt.  Interpreter's name is- Technical sales engineer

## 2019-12-14 NOTE — Progress Notes (Signed)
Vs CW ° °

## 2019-12-14 NOTE — Op Note (Signed)
[facility] Patient Name: Kara Saunders Procedure Date: 12/14/2019 2:39 PM MRN: [id number] Endoscopist: Thornton Park MD, MD Age: 69 Referring MD:  Date of Birth: [birth year] Gender: Female Account #: [id number] Procedure:                Colonoscopy Indications:              Abdominal pain, Change in bowel habits, Weight loss Medicines:                Monitored Anesthesia Care Procedure:                Pre-Anesthesia Assessment:                           - Prior to the procedure, a History and Physical                            was performed, and patient medications and                            allergies were reviewed. The patient's tolerance of                            previous anesthesia was also reviewed. The risks                            and benefits of the procedure and the sedation                            options and risks were discussed with the patient.                            All questions were answered, and informed consent                            was obtained. Prior Anticoagulants: The patient has                            taken no previous anticoagulant or antiplatelet                            agents. ASA Grade Assessment: II - A patient with                            mild systemic disease. After reviewing the risks                            and benefits, the patient was deemed in                            satisfactory condition to undergo the procedure.                           After obtaining informed consent, the colonoscope  was passed under direct vision. Throughout the                            procedure, the patient's blood pressure, pulse, and                            oxygen saturations were monitored continuously. The                            Colonoscope was introduced through the anus and                            advanced to the 3 cm into the ileum. The                            colonoscopy  was performed without difficulty. The                            patient tolerated the procedure well. The quality                            of the bowel preparation was good. The ileocecal                            valve, appendiceal orifice, and rectum were                            photographed. Scope In: 3:04:47 PM Scope Out: 3:21:52 PM Scope Withdrawal Time: 0 hours 12 minutes 37 seconds  Total Procedure Duration: 0 hours 17 minutes 5 seconds  Findings:                 The perianal and digital rectal examinations were                            normal.                           A 1 mm polyp was found in the sigmoid colon. The                            polyp was flat. Biopsies were taken with a cold                            forceps for histology. Estimated blood loss was                            minimal.                           The colon (entire examined portion) appeared                            normal. Biopsies were taken with a cold forceps for  histology.                           The exam was otherwise without abnormality on                            direct and retroflexion views except for internal                            hemorhoids. The examined terminal ileum appeared                            normal. Complications:            No immediate complications. Estimated blood loss:                            Minimal. Estimated Blood Loss:     Estimated blood loss was minimal. Impression:               - One 1 mm polyp in the sigmoid colon. Biopsied.                           - Internal hemorrhoids.                           - The entire examined colon is normal. Biopsied for                            microscopic colitis.                           - The examination was otherwise normal on direct                            and retroflexion views. Recommendation:           - Patient has a contact number available for                             emergencies. The signs and symptoms of potential                            delayed complications were discussed with the                            patient. Return to normal activities tomorrow.                            Written discharge instructions were provided to the                            patient.                           - Resume previous diet.                           -  Continue present medications.                           - Await pathology results.                           - Repeat colonoscopy in 10 years for surveillance,                            earlier with new symptoms.                           - Emerging evidence supports eating a diet of                            fruits, vegetables, grains, calcium, and yogurt                            while reducing red meat and alcohol may reduce the                            risk of colon cancer. Thornton Park MD, MD 12/14/2019 3:34:55 PM This report has been signed electronically.

## 2019-12-14 NOTE — Op Note (Signed)
[facility] Patient Name: Kara Saunders Procedure Date: 12/14/2019 2:40 PM MRN: [id number] Endoscopist: Thornton Park MD, MD Age: 69 Referring MD:  Date of Birth: 08-07-1950 Gender: Female Account #: [id number] Procedure:                Upper GI endoscopy Indications:              Abdominal pain, Early satiety, Weight loss Medicines:                Monitored Anesthesia Care Procedure:                Pre-Anesthesia Assessment:                           - Prior to the procedure, a History and Physical                            was performed, and patient medications and                            allergies were reviewed. The patient's tolerance of                            previous anesthesia was also reviewed. The risks                            and benefits of the procedure and the sedation                            options and risks were discussed with the patient.                            All questions were answered, and informed consent                            was obtained. Prior Anticoagulants: The patient has                            taken no previous anticoagulant or antiplatelet                            agents. ASA Grade Assessment: II - A patient with                            mild systemic disease. After reviewing the risks                            and benefits, the patient was deemed in                            satisfactory condition to undergo the procedure.                           After obtaining informed consent, the endoscope was  passed under direct vision. Throughout the                            procedure, the patient's blood pressure, pulse, and                            oxygen saturations were monitored continuously. The                            Endoscope was introduced through the mouth, and                            advanced to the third part of duodenum. The upper                            GI  endoscopy was accomplished without difficulty.                            The patient tolerated the procedure well. Scope In: Scope Out: Findings:                 LA Grade A (one or more mucosal breaks less than 5                            mm, not extending between tops of 2 mucosal folds)                            esophagitis with no bleeding was found. Biopsies                            were taken from the distal esophagus with a cold                            forceps for histology. Estimated blood loss was                            minimal.                           Localized mildly erythematous mucosa without                            bleeding was found in the prepyloric region of the                            stomach. Biopsies were taken from the antrum, body,                            and fundus with a cold forceps for histology.                            Estimated blood loss was minimal.  The examined duodenum was normal. Biopsies were                            taken with a cold forceps for histology. Estimated                            blood loss was minimal.                           The cardia and gastric fundus were normal on                            retroflexion.                           The exam was otherwise without abnormality. Complications:            No immediate complications. Estimated Blood Loss:     Estimated blood loss: none. Impression:               - LA Grade A reflux esophagitis with no bleeding.                            Biopsied.                           - Erythematous mucosa in the prepyloric region of                            the stomach. Biopsied.                           - Normal examined duodenum. Biopsied.                           - The examination was otherwise normal. Recommendation:           - Patient has a contact number available for                            emergencies. The signs and symptoms of  potential                            delayed complications were discussed with the                            patient. Return to normal activities tomorrow.                            Written discharge instructions were provided to the                            patient.                           - Resume previous diet.                           -  Continue present medications. Start pantoprazole                            40 mg QAM x 8 weeks.                           - Await pathology results.                           - Proceed with colonoscopy as previously planned.                           - Plan CT abd/pelvis if the biopsies are                            non-diagnostic. Thornton Park MD, MD 12/14/2019 3:31:25 PM This report has been signed electronically.

## 2019-12-14 NOTE — Progress Notes (Signed)
pt tolerated well. VSS. awake and to recovery. Report given to RN. Bite block inserted and removed without trauma. 

## 2019-12-16 ENCOUNTER — Telehealth: Payer: Self-pay | Admitting: *Deleted

## 2019-12-16 NOTE — Telephone Encounter (Signed)
Spoke with patient's sister because patient is deaf.  Not currently with patient bu she will touch base with her and if there are any concerns, she will reach out on behalf of her sister.

## 2019-12-19 ENCOUNTER — Encounter: Payer: Self-pay | Admitting: Gastroenterology

## 2019-12-29 ENCOUNTER — Other Ambulatory Visit: Payer: Self-pay | Admitting: Gastroenterology

## 2019-12-29 NOTE — Telephone Encounter (Signed)
Pt's cousin Malena Edman is requesting a refill on the pt's pantoprazole.

## 2019-12-29 NOTE — Telephone Encounter (Signed)
Lm on vm for patient to return call 

## 2020-01-03 NOTE — Progress Notes (Signed)
CT Chest reviewed. No changes. Will arrange for follow-up in November 2021 for routine visit and to discuss whether to pursue follow-up imaging in one year (11/2020)  Staff, please place on schedule for 04/2020 with me. Will need sign language interpreter

## 2020-01-04 NOTE — Progress Notes (Signed)
Tried calling the pt- LMTCB x 1

## 2020-01-09 ENCOUNTER — Other Ambulatory Visit: Payer: Self-pay

## 2020-01-09 DIAGNOSIS — K209 Esophagitis, unspecified without bleeding: Secondary | ICD-10-CM

## 2020-01-09 MED ORDER — PANTOPRAZOLE SODIUM 40 MG PO TBEC: 40.0000 mg | DELAYED_RELEASE_TABLET | Freq: Every day | ORAL | 1 refills | Status: DC

## 2020-01-09 NOTE — Telephone Encounter (Signed)
Refill sent to pharmacy.   

## 2020-01-17 ENCOUNTER — Encounter: Payer: Self-pay | Admitting: *Deleted

## 2020-02-07 ENCOUNTER — Other Ambulatory Visit: Payer: Self-pay | Admitting: Gastroenterology

## 2020-02-07 DIAGNOSIS — K209 Esophagitis, unspecified without bleeding: Secondary | ICD-10-CM

## 2020-02-14 ENCOUNTER — Ambulatory Visit (INDEPENDENT_AMBULATORY_CARE_PROVIDER_SITE_OTHER): Payer: Medicare HMO | Admitting: Gastroenterology

## 2020-02-14 ENCOUNTER — Encounter: Payer: Self-pay | Admitting: Gastroenterology

## 2020-02-14 VITALS — BP 130/68 | HR 64 | Ht 63.0 in | Wt 137.4 lb

## 2020-02-14 DIAGNOSIS — R6881 Early satiety: Secondary | ICD-10-CM | POA: Diagnosis not present

## 2020-02-14 NOTE — Patient Instructions (Signed)
Continue dicyclomine 20mg  four times daily as needed.   Follow-up as needed.   If you are age 69 or older, your body mass index should be between 23-30. Your Body mass index is 24.34 kg/m. If this is out of the aforementioned range listed, please consider follow up with your Primary Care Provider.  If you are age 17 or younger, your body mass index should be between 19-25. Your Body mass index is 24.34 kg/m. If this is out of the aformentioned range listed, please consider follow up with your Primary Care Provider.    Thank you for choosing me and [facility].  Dr. Thornton Park

## 2020-02-14 NOTE — Progress Notes (Signed)
Referring Provider: Vassie Moment, MD Primary Care Physician:  Vassie Moment, MD  Reason for Consultation:  Early satiety   IMPRESSION:  LA Class  Reflux esophagitis Gastritis with focal intestinal metaplasia Recent early satiety with unintentional weight loss of 30-40 pounds over the last year now improving Intermittent abdominal pain x years    - CT abd/pelvis with contrast 0/53/97: small umbilical hernia, small left inguinal hernia Alternating diarrhea and constipation    - normal random colon biopsies 12/14/19 Regular marijuana use History of hyperplastic polyps on colonoscopy 2012, 2021 Pulmonary nodules, followed by Dr. Loanne Drilling No known family history of colon cancer or polyps  Unexplained weight loss in the setting of longstanding history of intermittent right sided abdominal pain and alternating bowel habits with occasional blood in the stool: CT scan last year did not provide a source for symptoms, but the CT scan preceded her weight loss. Likely has underlying IBS, but, this would not explain the weight loss or blood in the stool. EGD showed by gastritis and reflux esophagitis. Clinically improving on treatment. Regaining her weight. Symptoms have improved.  Low threshold to repeat CT scan with persistent/worsening symptoms. She did not feel additional evaluation was needed today.    Focal intestinal metaplasia: She has no known risk factors for gastric cancer. Consider repeat EGD with gastric mapping.  PLAN: Continue dicylclomine 20 mg QID prn abdominal pain She will call me with any recurrent concerns to proceed with contrasted CT of the abd/pelvis Routine surveillance colonoscopy due 2031 Office follow-up in 6 months, earlier if needed  I spent 30 minutes, including in depth chart review, communicating results with the patient directly, face-to-face time with the patient, coordinating care, and ordering studies and medications as appropriate, and documentation.  HPI:  Kara Saunders is a 69 y.o. female initially seen in consultation for early satiety, intermittent non-radiating right-sided to mid-abdominal pain/cramping that improves with defecation, alternating bowel habits, weight loss of 30-40 pounds. The interval history is obtained through the patient with the assistance of a sign language interpreter and review of her electronic health record.  She has hypertension, hypercholesterolemia, bilateral deafness, pulmonary nodules under evaluation by Dr. Ebony Hail.  She regularly uses marijuana. Prior cholecystectomy.   Symptoms have occurred intermittently over the years.  Previously seen by Dr. Olevia Perches for periumbilical cramping, abdominal pain, and constipation in 2012.  She notes those symptoms seem to come and go over the years. Colonoscopy in 2012 revealed 8 diminutive hyperplastic polyps. Repeat colonoscopy recommended in 10 years.  Follows a high fiber diet, drinks water and walks to keep her bowel moving. Ocasionally uses stool softeners but she can't remember the name.  CT of the abdomen and pelvis with contrast 01/26/2019 to evaluate months of abdominal pain showed no acute findings.  There is a small fat-containing umbilical hernia.  A small fat-containing left inguinal hernia.  Multiple small pulmonary nodules.  Follows a high fiber diet, drinks water and walks to keep her bowel moving. Ocasionally uses stool softeners but she can't remember the name.   EGD 12/14/19 showed LA Grade A reflux esophagitis, gastritis, and normal duodenum. Biopsies showed chronic gastritis with focal intestinal metaplasia and no H pylori. Duodenal biopsies were normal. Esophageal biopsies confirmed reflex. There was no eosinophilic esophagitis.  Colonoscopy 12/14/19 showed a hyperplastic sigmoid polyp and was otherwise normal. Random colon biopsies were negative for microscopic colitis.   She returns in scheduled follow-up. Notes that she is overall doing much better. She is eating  better  and feels that she has gained some weight. Eating more fruit. Has rare, unpredictable LUQ pain. Does not feel that additional evaluation is needed at this time.     Past Medical History:  Diagnosis Date  . Arthritis   . Cardiomegaly   . Deaf   . Depression   . High cholesterol   . Hypertension     Past Surgical History:  Procedure Laterality Date  . ABDOMINAL HYSTERECTOMY    . FOOT SURGERY     bilateral  . INGUINAL HERNIA REPAIR    . TUMOR EXCISION     finger    Current Outpatient Medications  Medication Sig Dispense Refill  . amLODipine (NORVASC) 5 MG tablet     . cholecalciferol (VITAMIN D) 1000 UNITS tablet Take 1,000 Units by mouth daily.    Marland Kitchen dicyclomine (BENTYL) 20 MG tablet Take 1 tablet (20 mg total) by mouth 4 (four) times daily as needed for spasms. 90 tablet 1  . lisinopril (PRINIVIL,ZESTRIL) 10 MG tablet Take 1 tablet (10 mg total) by mouth daily. 30 tablet 0  . meloxicam (MOBIC) 15 MG tablet Take 1 tablet (15 mg total) by mouth daily. 30 tablet 0  . naproxen (NAPROSYN) 375 MG tablet Take 375 mg by mouth 2 (two) times daily as needed (for pain).     . pantoprazole (PROTONIX) 40 MG tablet Take 1 tablet (40 mg total) by mouth daily. 30 tablet 1  . simvastatin (ZOCOR) 20 MG tablet Take 20 mg by mouth every evening.     . triamterene-hydrochlorothiazide (DYAZIDE) 37.5-25 MG per capsule Take 1 capsule by mouth daily.      No current facility-administered medications for this visit.    Allergies as of 02/14/2020  . (No Known Allergies)    Family History  Problem Relation Age of Onset  . Stroke Father   . Prostate cancer Father   . Hypertension Paternal Grandmother   . Stroke Paternal Uncle   . Stomach cancer Paternal Uncle   . Breast cancer Cousin     Social History   Socioeconomic History  . Marital status: Married    Spouse name: Not on file  . Number of children: 1  . Years of education: Not on file  . Highest education level: Not on file   Occupational History  . Occupation: disabled  Tobacco Use  . Smoking status: Never Smoker  . Smokeless tobacco: Never Used  Vaping Use  . Vaping Use: Never used  Substance and Sexual Activity  . Alcohol use: No  . Drug use: Yes    Types: Marijuana    Comment: last weed use 6July 21  . Sexual activity: Yes  Other Topics Concern  . Not on file  Social History Narrative   Maybe 1 cup of coffee a day   Social Determinants of Health   Financial Resource Strain:   . Difficulty of Paying Living Expenses: Not on file  Food Insecurity:   . Worried About Charity fundraiser in the Last Year: Not on file  . Ran Out of Food in the Last Year: Not on file  Transportation Needs:   . Lack of Transportation (Medical): Not on file  . Lack of Transportation (Non-Medical): Not on file  Physical Activity:   . Days of Exercise per Week: Not on file  . Minutes of Exercise per Session: Not on file  Stress:   . Feeling of Stress : Not on file  Social Connections:   . Frequency of  Communication with Friends and Family: Not on file  . Frequency of Social Gatherings with Friends and Family: Not on file  . Attends Religious Services: Not on file  . Active Member of Clubs or Organizations: Not on file  . Attends Archivist Meetings: Not on file  . Marital Status: Not on file  Intimate Partner Violence:   . Fear of Current or Ex-Partner: Not on file  . Emotionally Abused: Not on file  . Physically Abused: Not on file  . Sexually Abused: Not on file    Review of Systems: 12 system ROS is negative except as noted above.   Physical Exam: General:   Alert,  well-nourished, pleasant and cooperative in NAD Head:  Normocephalic and atraumatic. Eyes:  Sclera clear, no icterus.   Conjunctiva pink. Ears:  Normal auditory acuity. Nose:  No deformity, discharge,  or lesions. Mouth:  No deformity or lesions.   Neck:  Supple; no masses or thyromegaly. Lungs:  Clear throughout to  auscultation.   No wheezes. Heart:  Regular rate and rhythm; no murmurs. Abdomen:  Soft,nontender, nondistended, normal bowel sounds, no rebound or guarding. No hepatosplenomegaly.   Rectal:  Deferred  Msk:  Symmetrical. No boney deformities LAD: No inguinal or umbilical LAD Extremities:  No clubbing or edema. Neurologic:  Alert and  oriented x4;  grossly nonfocal Skin:  Intact without significant lesions or rashes. Psych:  Alert and cooperative. Normal mood and affect.    Temitope Flammer L. Tarri Glenn, MD, MPH 02/14/2020, 3:43 PM

## 2020-02-15 ENCOUNTER — Encounter: Payer: Self-pay | Admitting: Gastroenterology

## 2020-04-26 ENCOUNTER — Other Ambulatory Visit: Payer: Self-pay | Admitting: Family Medicine

## 2020-04-26 DIAGNOSIS — Z1231 Encounter for screening mammogram for malignant neoplasm of breast: Secondary | ICD-10-CM

## 2020-06-12 ENCOUNTER — Other Ambulatory Visit: Payer: Self-pay | Admitting: Gastroenterology

## 2020-06-12 DIAGNOSIS — K209 Esophagitis, unspecified without bleeding: Secondary | ICD-10-CM

## 2020-06-29 ENCOUNTER — Ambulatory Visit: Payer: Medicare HMO
